# Patient Record
Sex: Female | Born: 1985
Health system: Southern US, Community
[De-identification: ages and names within clinical notes are randomized; demographics above are authoritative.]

## PROBLEM LIST (undated history)

## (undated) DIAGNOSIS — M549 Dorsalgia, unspecified: Secondary | ICD-10-CM

## (undated) DIAGNOSIS — K5900 Constipation, unspecified: Secondary | ICD-10-CM

## (undated) DIAGNOSIS — F329 Major depressive disorder, single episode, unspecified: Secondary | ICD-10-CM

## (undated) DIAGNOSIS — F32A Depression, unspecified: Secondary | ICD-10-CM

## (undated) DIAGNOSIS — F419 Anxiety disorder, unspecified: Secondary | ICD-10-CM

## (undated) HISTORY — DX: Dorsalgia, unspecified: M54.9

## (undated) HISTORY — DX: Anxiety disorder, unspecified: F41.9

## (undated) HISTORY — DX: Major depressive disorder, single episode, unspecified: F32.9

## (undated) HISTORY — DX: Depression, unspecified: F32.A

## (undated) HISTORY — DX: Constipation, unspecified: K59.00

---
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
## 2012-10-27 ENCOUNTER — Encounter: Payer: Self-pay | Admitting: Obstetrics & Gynecology

## 2012-10-27 ENCOUNTER — Ambulatory Visit (INDEPENDENT_AMBULATORY_CARE_PROVIDER_SITE_OTHER): Payer: No Typology Code available for payment source | Admitting: Obstetrics & Gynecology

## 2012-10-27 ENCOUNTER — Telehealth: Payer: Self-pay | Admitting: Obstetrics & Gynecology

## 2012-10-27 VITALS — BP 122/90 | HR 80 | Resp 20 | Ht 66.0 in | Wt 232.4 lb

## 2012-10-27 DIAGNOSIS — Z0000 Encounter for general adult medical examination without abnormal findings: Secondary | ICD-10-CM

## 2012-10-27 DIAGNOSIS — Z01419 Encounter for gynecological examination (general) (routine) without abnormal findings: Secondary | ICD-10-CM

## 2012-10-27 DIAGNOSIS — Z30433 Encounter for removal and reinsertion of intrauterine contraceptive device: Secondary | ICD-10-CM

## 2012-10-27 DIAGNOSIS — Z124 Encounter for screening for malignant neoplasm of cervix: Secondary | ICD-10-CM

## 2012-10-27 NOTE — Telephone Encounter (Signed)
Patient calling to let Dr. Hyacinth Meeker know she made it safely home and also to say "thank you" for helping her. FYI only, no reason to call.

## 2012-10-27 NOTE — Progress Notes (Signed)
27 y.o. G0P0000 Married CaucasianF here for annual exam.  Has Mirena IUD that was due to be changed 8/14.  Patient doesn't really keep up with cycles.  Lives in [place].  Wants IUD replaced today.  Patient's last menstrual period was 10/03/2012.          Sexually active: yes  The current method of family planning is IUD.    Exercising: no  not regularly Smoker:  no  Health Maintenance: Pap:  ? 2009, was last done here History of abnormal Pap:  no MMG:  none Colonoscopy:  none BMD:   none TDaP:  Up to date-around 2007 Screening Labs: n/a, Hb today: n/a, Urine today: WBC-trace, PH-6.0   reports that she has never smoked. She has never used smokeless tobacco. She reports that she drinks alcohol. She reports that she does not use illicit drugs.  History reviewed. No pertinent past medical history.  History reviewed. No pertinent past surgical history.  Current Outpatient Prescriptions  Medication Sig Dispense Refill  . levonorgestrel (MIRENA) 20 MCG/24HR IUD 1 each by Intrauterine route once.       No current facility-administered medications for this visit.    Family History  Problem Relation Age of Onset  . Breast cancer Maternal Aunt   . Diabetes Mother   . Stroke Maternal Grandmother     ROS:  Pertinent items are noted in HPI.  Otherwise, a comprehensive ROS was negative.  Exam:   BP 122/90  Pulse 80  Resp 20  Ht 5\' 6"  (1.676 m)  Wt 232 lb 6.4 oz (105.416 kg)  BMI 37.53 kg/m2  LMP 10/03/2012  Height: 5\' 6"  (167.6 cm)  Ht Readings from Last 3 Encounters:  10/27/12 5\' 6"  (1.676 m)    General appearance: alert, cooperative and appears stated age Head: Normocephalic, without obvious abnormality, atraumatic Neck: no adenopathy, supple, symmetrical, trachea midline and thyroid normal to inspection and palpation Lungs: clear to auscultation bilaterally Breasts: normal appearance, no masses or tenderness Heart: regular rate and rhythm Abdomen: soft, non-tender;  bowel sounds normal; no masses,  no organomegaly Extremities: extremities normal, atraumatic, no cyanosis or edema Skin: Skin color, texture, turgor normal. No rashes or lesions Lymph nodes: Cervical, supraclavicular, and axillary nodes normal. No abnormal inguinal nodes palpated Neurologic: Grossly normal   Pelvic: External genitalia:  no lesions              Urethra:  normal appearing urethra with no masses, tenderness or lesions              Bartholins and Skenes: normal                 Vagina: normal appearing vagina with normal color and discharge, no lesions              Cervix: no lesions              Pap taken: yes Bimanual Exam:  Uterus:  normal size, contour, position, consistency, mobility, non-tender              Adnexa: normal adnexa and no mass, fullness, tenderness               Rectovaginal: Confirms               Anus:  normal sphincter tone, no lesions  IUD due to be changed.  D/w pt rare chance of early pregnancy but given that she hasn't been here in over 3 years, I feel benefits outweigh risks.  Patient in agreement.  Consent obtained.    Procedure:  Speculum placed.  IUD string noted.  IUD removed with one pull.  IUD seen by Lorrene Reid, CMA, and discarded.  Cervix cleansed with Betadine x 3.  Tenaculum placed at 12 o'clock on anterior lip of cervix.  Uterus sounded to 8 cm.  IUD and introducer passed to fundus without difficulty.  IUD released.  Introducer removed.  Strings cut to 2cm.  Pt tolerated procedure well.  Tenaculum removed.  Minimal bleeding from tenaculum site.   Lot # [id number]. Exp 11/16.  A:  Well Woman with normal exam IUD removal/replacement today.  P:   Mammogram starting age 30 pap smear today. Recheck IUD strings 6 weeks. return annually or prn  An After Visit Summary was printed and given to the patient.

## 2012-10-27 NOTE — Patient Instructions (Signed)

## 2012-10-28 LAB — IPS PAP SMEAR ONLY

## 2012-12-07 ENCOUNTER — Telehealth: Payer: Self-pay | Admitting: Obstetrics & Gynecology

## 2012-12-07 NOTE — Telephone Encounter (Signed)
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 Rescheduled for 12-15-12 at 3 pm.

## 2012-12-07 NOTE — Telephone Encounter (Signed)
Patient is worried about making it on time to her 2:30 PM appointment with Dr. Hyacinth Meeker 12/08/12 for a six week recheck. Patient does not want to cancel the appointment but does want to speak with the nurse about possible rescheduling her to later in the week.

## 2012-12-08 ENCOUNTER — Ambulatory Visit: Payer: No Typology Code available for payment source | Admitting: Obstetrics & Gynecology

## 2012-12-15 ENCOUNTER — Encounter: Payer: Self-pay | Admitting: Obstetrics & Gynecology

## 2012-12-15 ENCOUNTER — Ambulatory Visit (INDEPENDENT_AMBULATORY_CARE_PROVIDER_SITE_OTHER): Payer: No Typology Code available for payment source | Admitting: Obstetrics & Gynecology

## 2012-12-15 ENCOUNTER — Ambulatory Visit: Payer: No Typology Code available for payment source | Admitting: Obstetrics & Gynecology

## 2012-12-15 VITALS — BP 122/84 | HR 80 | Resp 20 | Ht 66.0 in | Wt 235.2 lb

## 2012-12-15 DIAGNOSIS — Z30431 Encounter for routine checking of intrauterine contraceptive device: Secondary | ICD-10-CM

## 2012-12-15 NOTE — Patient Instructions (Signed)
Please call if you have any new issues/problems 

## 2012-12-15 NOTE — Progress Notes (Signed)
27 y.o. G43P0000 Married Caucasian female presents for recheck after having Mirena IUD placed.  Had occasional cramping after placement.  Cycle was light and darkish.  Lasted 3 days.  No spotting.  No pain with intercourse.    LMP:  Patient's last menstrual period was 12/04/2012.  Pelvic exam: Vulva:  normal Vagina:  normal vagina Cervix:  Non-tender, Negative CMT, no lesions or redness, IUD string noted and 2.5cm in length Uterus:normal size, non tender   A: Recheck after IUD placement  P: Pt to return for AEX 1 year or prn problems.

## 2015-03-26 ENCOUNTER — Encounter (HOSPITAL_COMMUNITY): Payer: Self-pay | Admitting: Emergency Medicine

## 2015-03-26 ENCOUNTER — Emergency Department (INDEPENDENT_AMBULATORY_CARE_PROVIDER_SITE_OTHER)
Admission: EM | Admit: 2015-03-26 | Discharge: 2015-03-26 | Disposition: A | Discharge status: Home / Self Care | Payer: Worker's Compensation | Source: Home / Self Care | Attending: Emergency Medicine | Admitting: Emergency Medicine

## 2015-03-26 DIAGNOSIS — M545 Low back pain, unspecified: Secondary | ICD-10-CM

## 2015-03-26 MED ORDER — MELOXICAM 15 MG PO TABS: 15.0000 mg | ORAL_TABLET | Freq: Every day | ORAL | Status: DC

## 2015-03-26 MED ORDER — PREDNISONE 50 MG PO TABS
ORAL_TABLET | ORAL | Status: DC
Start: 2015-03-26 — End: 2017-09-11

## 2015-03-26 MED ORDER — TRAMADOL HCL 50 MG PO TABS: 50.0000 mg | ORAL_TABLET | Freq: Four times a day (QID) | ORAL | Status: DC | PRN

## 2015-03-26 NOTE — ED Provider Notes (Signed)
CSN: [id number]     Arrival date & time 03/26/15  1614 History   First MD Initiated Contact with Patient 03/26/15 1707     Chief Complaint  Patient presents with  . Back Pain   (Consider location/radiation/quality/duration/timing/severity/associated sxs/prior Treatment) HPI  She is a 30 year old woman here for evaluation of back pain. She states that on Friday she is working for the [employer] when she picked up some packages.  She states the packages weight over 100 pounds. She had to load them onto a cart and then push some upper ramp. She states at that time she felt some soreness in her lower back. Yesterday, she had worsening of the pain, particularly with forward flexion. She states the pain would radiate into her right buttock and also up her back. This was improved with a heating pad. She denies any radicular pain. No numbness, tingling, weakness. No bowel or bladder incontinence. No history of back problems.  History reviewed. No pertinent past medical history. History reviewed. No pertinent past surgical history. Family History  Problem Relation Age of Onset  . Breast cancer Maternal Aunt   . Diabetes Mother   . Stroke Maternal Grandmother    Social History  Substance Use Topics  . Smoking status: Never Smoker   . Smokeless tobacco: Never Used  . Alcohol Use: Yes     Comment: occ   OB History    Gravida Para Term Preterm AB TAB SAB Ectopic Multiple Living   0 0 0 0 0 0 0 0 0 0      Review of Systems As in history of present illness Allergies  Review of patient's allergies indicates no known allergies.  Home Medications   Prior to Admission medications   Medication Sig Start Date End Date Taking? Authorizing Provider  levonorgestrel (MIRENA) 20 MCG/24HR IUD 1 each by Intrauterine route once.    Historical Provider, MD  meloxicam (MOBIC) 15 MG tablet Take 1 tablet (15 mg total) by mouth daily. 03/26/15   Charm RingsErin J Rania Prothero, MD  predniSONE (DELTASONE) 50 MG tablet Take 1  pill daily for 5 days. 03/26/15   Charm RingsErin J Shihab States, MD  traMADol (ULTRAM) 50 MG tablet Take 1 tablet (50 mg total) by mouth every 6 (six) hours as needed. 03/26/15   Charm RingsErin J Aishani Kalis, MD   Meds Ordered and Administered this Visit  Medications - No data to display  BP 153/92 mmHg  Pulse 92  Temp(Src) 98.3 F (36.8 C) (Oral)  Resp 18  SpO2 100% No data found.   Physical Exam  Constitutional: She is oriented to person, place, and time. She appears well-developed and well-nourished. No distress.  Cardiovascular: Normal rate.   Pulmonary/Chest: Effort normal.  Musculoskeletal:  Back: No erythema or edema. No vertebral step-offs. She is tender in the midline lumbar area. There is some mild spasm in the bilateral parathoracic spinous muscles. 5 out of 5 strength in bilateral lower extremities. Negative straight leg raise bilaterally.  Neurological: She is alert and oriented to person, place, and time.    ED Course  Procedures (including critical care time)  Labs Review Labs Reviewed - No data to display  Imaging Review No results found.   MDM   1. Midline low back pain without sciatica    Treat with prednisone and meloxicam. Alternate ice and heat. Prescription for tramadol given to use as needed for severe pain. Work note provided. No heavy lifting for the next week. If not improving in the next 5-7 days,  follow-up with occupational health.    Charm Rings, MD 03/26/15 [phone]

## 2015-03-26 NOTE — ED Notes (Signed)
Pt reports she inj her back on 3/17 while at work.... Back pain increases w/activity and when she bends over A&O x4... Slow gait; No acute disterss

## 2015-03-26 NOTE — Discharge Instructions (Signed)
You strained the muscles and tendons of your back. Take prednisone daily for 5 days. Take meloxicam daily for 5 days, then as needed for pain. Use the tramadol at bedtime as needed for pain. This medicine will make you drowsy. Alternate ice and heat to your back. If things are not improving in the next week, please follow-up at occupational health.

## 2015-10-06 ENCOUNTER — Telehealth: Payer: Self-pay | Admitting: Obstetrics & Gynecology

## 2015-10-06 NOTE — Telephone Encounter (Signed)
Left patient a message to call back to reschedule a future appointment that was cancelled by the provider. °

## 2015-10-20 ENCOUNTER — Ambulatory Visit: Payer: No Typology Code available for payment source | Admitting: Obstetrics & Gynecology

## 2016-12-11 DIAGNOSIS — G8929 Other chronic pain: Secondary | ICD-10-CM | POA: Insufficient documentation

## 2016-12-11 DIAGNOSIS — F3341 Major depressive disorder, recurrent, in partial remission: Secondary | ICD-10-CM | POA: Insufficient documentation

## 2016-12-11 DIAGNOSIS — M545 Low back pain: Secondary | ICD-10-CM

## 2017-09-11 ENCOUNTER — Other Ambulatory Visit (HOSPITAL_COMMUNITY)
Admission: RE | Admit: 2017-09-11 | Discharge: 2017-09-11 | Disposition: A | Discharge status: Home / Self Care | Payer: No Typology Code available for payment source | Source: Ambulatory Visit | Attending: Obstetrics & Gynecology | Admitting: Obstetrics & Gynecology

## 2017-09-11 ENCOUNTER — Other Ambulatory Visit: Payer: Self-pay

## 2017-09-11 ENCOUNTER — Encounter: Payer: Self-pay | Admitting: Obstetrics & Gynecology

## 2017-09-11 ENCOUNTER — Ambulatory Visit: Payer: No Typology Code available for payment source | Admitting: Obstetrics & Gynecology

## 2017-09-11 VITALS — BP 110/64 | HR 88 | Resp 14 | Ht 65.75 in | Wt 196.0 lb

## 2017-09-11 DIAGNOSIS — Z01419 Encounter for gynecological examination (general) (routine) without abnormal findings: Secondary | ICD-10-CM

## 2017-09-11 DIAGNOSIS — Z202 Contact with and (suspected) exposure to infections with a predominantly sexual mode of transmission: Secondary | ICD-10-CM | POA: Diagnosis not present

## 2017-09-11 DIAGNOSIS — N766 Ulceration of vulva: Secondary | ICD-10-CM

## 2017-09-11 DIAGNOSIS — Z124 Encounter for screening for malignant neoplasm of cervix: Secondary | ICD-10-CM | POA: Insufficient documentation

## 2017-09-11 DIAGNOSIS — Z30433 Encounter for removal and reinsertion of intrauterine contraceptive device: Secondary | ICD-10-CM | POA: Diagnosis not present

## 2017-09-11 NOTE — Progress Notes (Signed)
32 y.o. G0P0000 Legally SeparatedCaucasianF here for annual exam.  Has been separated for 2+ years.  Has an IUD.  Does not cycle regularly.  Bleeding is minimal when she cycles.  Is due for removal in October but would like to have it removed today if possible.    Weighed #246 at her heaviest.  Wende Crease #196 today.  Has been exercising and working on dietary changes.  This weight loss has been over a two year time frame.    Patient's last menstrual period was 08/06/2017 (approximate).          Sexually active: Yes.    The current method of family planning is IUD. Mirena placed 10/27/12   Exercising: No.   Smoker:  yes  Health Maintenance: Pap:  10/27/12 neg  History of abnormal Pap:  no MMG:  Never TDaP:  Unsure Screening Labs: PCP   reports that she has been smoking. She has been smoking about 0.50 packs per day. She has never used smokeless tobacco. She reports that she drank alcohol. She reports that she does not use drugs.  Past Medical History:  Diagnosis Date  . Anxiety   . Depression     History reviewed. No pertinent surgical history.  Current Outpatient Medications  Medication Sig Dispense Refill  . levonorgestrel (MIRENA) 20 MCG/24HR IUD 1 each by Intrauterine route once.    . naproxen sodium (ALEVE) 220 MG tablet Take by mouth daily as needed.    . pseudoephedrine-acetaminophen (TYLENOL SINUS) 30-500 MG TABS tablet Take by mouth daily as needed.     No current facility-administered medications for this visit.     Family History  Problem Relation Age of Onset  . Breast cancer Maternal Aunt   . Diabetes Mother   . Stroke Maternal Grandmother   . Lung cancer Maternal Grandfather   . Breast cancer Maternal Aunt   . Breast cancer Maternal Aunt     Review of Systems  All other systems reviewed and are negative.   Exam:   BP 110/64 (BP Location: Right Arm, Patient Position: Sitting, Cuff Size: Large)   Pulse 88   Resp 14   Ht 5' 5.75" (1.67 m)   Wt 196 lb  (88.9 kg)   LMP 08/06/2017 (Approximate)   BMI 31.88 kg/m     Height: 5' 5.75" (167 cm)  Ht Readings from Last 3 Encounters:  09/11/17 5' 5.75" (1.67 m)  12/15/12 5\' 6"  (1.676 m)  10/27/12 5\' 6"  (1.676 m)    General appearance: alert, cooperative and appears stated age Head: Normocephalic, without obvious abnormality, atraumatic Neck: no adenopathy, supple, symmetrical, trachea midline and thyroid normal to inspection and palpation Lungs: clear to auscultation bilaterally Breasts: normal appearance, no masses or tenderness Heart: regular rate and rhythm Abdomen: soft, non-tender; bowel sounds normal; no masses,  no organomegaly Extremities: extremities normal, atraumatic, no cyanosis or edema Skin: Skin color, texture, turgor normal. No rashes or lesions Lymph nodes: Cervical, supraclavicular, and axillary nodes normal. No abnormal inguinal nodes palpated Neurologic: Grossly normal   Pelvic: External genitalia:  Small ulcerated lesion just on edge of labia majora (pt reports this is an area where she's squeezed an ingrown hair)              Urethra:  normal appearing urethra with no masses, tenderness or lesions              Bartholins and Skenes: normal  Vagina: normal appearing vagina with normal color and discharge, no lesions              Cervix: no lesions              Pap taken: Yes.   Bimanual Exam:  Uterus:  normal size, contour, position, consistency, mobility, non-tender              Adnexa: no mass, fullness, tenderness               Rectovaginal: Confirms               Anus:  normal sphincter tone, no lesions  IUD removal and placement:   Consent obtianed.  Speculum placed.  Cervix cleansed with Betadine.  IUD string noted and grasped with one pull.  IUD easily removed.  Cervix cleansed again.  Anteriior lip of cervix grasped with single toothed tenaculum.  Uterus sounded to 8cm.  IUD and introducer passed to fundus and slightly withdrawn.  IUD released  into endometrial cavity without difficulty.  Lot:  [id number].  Exp:  11/21.  Strings cut to 2cm.  Tenaculum removed.  Pt tolerated procedure will.  Minimal bleeding noted.  Pt did feel a little light headed after procedure which passed quickly but she decided to not have any blood work or her TDAP updated today.  Will do these when she returns for recheck.  Chaperone was present for exam.  A:  Well Woman with normal exam Mirena IUD for contraception and cycle control, due for removal after 10/27/17 Tdap update due Desires STD testing  P:   Mammogram guidelines reviewed.   pap smear obtained today IUD removed and replaced today GC/CHl/Trich obtained today. Pt will have HIV, RPR and HepBSAg, Hep C obtained at follow up.   Tdap will be updated at next visit as well. Return annually or prn

## 2017-09-12 LAB — HERPES SIMPLEX VIRUS(HSV) DNA BY PCR

## 2017-09-12 LAB — CYTOLOGY - PAP
Chlamydia: NEGATIVE
Diagnosis: NEGATIVE
Neisseria Gonorrhea: NEGATIVE
TRICH (WINDOWPATH): NEGATIVE

## 2017-09-15 LAB — HSV NAA
HSV 1 NAA: NEGATIVE
HSV 2 NAA: NEGATIVE

## 2017-09-15 LAB — SPECIMEN STATUS REPORT

## 2017-09-23 ENCOUNTER — Telehealth: Payer: Self-pay | Admitting: Obstetrics & Gynecology

## 2017-09-23 NOTE — Telephone Encounter (Signed)
Call returned to patient, left detailed message, ok per dpr. Advised no additional recommendations from Dr. Hyacinth MeekerMiller. Keep f/u as scheduled for 10/2017. Return call to office if any additional questions. Encounter closed.

## 2017-09-23 NOTE — Telephone Encounter (Signed)
Patient is still bleeding after having an iud inserted.

## 2017-09-23 NOTE — Telephone Encounter (Signed)
Spoke with patient. Mirena IUD exchange on 09/11/17. Reports cycle has continued since exchange, does not recall cycles this long with previous Mirena.   Describes bleeding as old blood to pink, "possibly spotting", denies heavy bleeding, unsure how many times she is changing a pad. Intermittent menses like cramps 3/10. Denies N/V, fever/chills, odor, fatigue, weakness, lightheadness.   Advised patient can take 3 months for cycles to adjust to IUD, cycles may be lighter or none at all. Advised to continue to monitor, if new symptoms develop, bleeding continues or becomes heavy, return call to office for OV. Advised will review with Dr. Hyacinth MeekerMiller and return call if any additional recommendations.   Routing to Dr. Hyacinth MeekerMiller to review.

## 2017-09-23 NOTE — Telephone Encounter (Signed)
Agree with recommendations.  Ok to close encounter. 

## 2017-10-16 ENCOUNTER — Ambulatory Visit: Payer: No Typology Code available for payment source | Admitting: Obstetrics & Gynecology

## 2017-10-16 ENCOUNTER — Encounter

## 2017-10-23 ENCOUNTER — Ambulatory Visit: Payer: No Typology Code available for payment source | Admitting: Obstetrics & Gynecology

## 2017-10-24 ENCOUNTER — Other Ambulatory Visit: Payer: Self-pay

## 2017-10-24 ENCOUNTER — Ambulatory Visit: Payer: No Typology Code available for payment source | Admitting: Obstetrics & Gynecology

## 2017-10-24 ENCOUNTER — Encounter: Payer: Self-pay | Admitting: Obstetrics & Gynecology

## 2017-10-24 VITALS — BP 100/80 | HR 88 | Resp 16 | Ht 65.75 in | Wt 203.0 lb

## 2017-10-24 DIAGNOSIS — Z30431 Encounter for routine checking of intrauterine contraceptive device: Secondary | ICD-10-CM | POA: Diagnosis not present

## 2017-10-24 DIAGNOSIS — R102 Pelvic and perineal pain: Secondary | ICD-10-CM | POA: Diagnosis not present

## 2017-10-24 NOTE — Progress Notes (Signed)
GYNECOLOGY  VISIT  CC:   IUD recheck, pelvic cramping   HPI: 32 y.o. G0P0000 Legally Separated Caucasian female here for Mirena IUD check.  Had some spotting initially after IUD placement.  Has not had a cycle yet.  Had some LLQ pain and cramping right after IUD placement.  Still having some cramping but this is much less.  Has boyfriend that she is currently SA with.  Had STD testing at last visit 09/11/17.  This was all negative.   States she thinks she may be psyching herself into something being wrong as she does worry.  Does not desire treatment.  Was diagnosed with bronchitis this week.  Using supportive therapy.  GYNECOLOGIC HISTORY: No LMP recorded. (Menstrual status: IUD). Contraception: Mirena IUD placed 09/11/17 Menopausal hormone therapy: none  Patient Active Problem List   Diagnosis Date Noted  . Recurrent major depressive disorder, in partial remission (HCC) 12/11/2016  . Chronic midline low back pain without sciatica 12/11/2016    Past Medical History:  Diagnosis Date  . Anxiety   . Depression     History reviewed. No pertinent surgical history.  MEDS:   Current Outpatient Medications on File Prior to Visit  Medication Sig Dispense Refill  . benzonatate (TESSALON) 100 MG capsule 3 (three) times daily as needed.    Marland Kitchen levonorgestrel (MIRENA) 20 MCG/24HR IUD 1 each by Intrauterine route once.    . naproxen sodium (ALEVE) 220 MG tablet Take by mouth daily as needed.    . promethazine-dextromethorphan (PROMETHAZINE-DM) 6.25-15 MG/5ML syrup Take by mouth at bedtime.    . pseudoephedrine-acetaminophen (TYLENOL SINUS) 30-500 MG TABS tablet Take by mouth daily as needed.     No current facility-administered medications on file prior to visit.     ALLERGIES: Patient has no known allergies.  Family History  Problem Relation Age of Onset  . Breast cancer Maternal Aunt        "Bonita Quin" died in her 20's  . Diabetes Mother   . Stroke Maternal Grandmother   . Lung cancer  Maternal Grandfather   . Breast cancer Maternal Aunt        "Patsy" in her 64's  . Breast cancer Maternal Aunt        has breast cancer precursor and bilateral mastectomy    SH:  Separated, +SA  Review of Systems  Constitutional:       Weight gain   Genitourinary: Positive for frequency and urgency.  Psychiatric/Behavioral: The patient is nervous/anxious.        Depression   All other systems reviewed and are negative.   PHYSICAL EXAMINATION:    BP 100/80 (BP Location: Right Arm, Patient Position: Sitting, Cuff Size: Large)   Pulse 88   Resp 16   Ht 5' 5.75" (1.67 m)   Wt 203 lb (92.1 kg)   BMI 33.01 kg/m     General appearance: alert, cooperative and appears stated age Abdomen: soft, non-tender; bowel sounds normal; no masses,  no organomegaly Lymph:  no inguinal LAD noted  Pelvic: External genitalia:  no lesions              Urethra:  normal appearing urethra with no masses, tenderness or lesions              Bartholins and Skenes: normal                 Vagina: normal appearing vagina with normal color and discharge, no lesions  Cervix: no lesions and 2cm IUD string noted              Bimanual Exam:  Uterus:  normal size, contour, position, consistency, mobility, non-tender              Adnexa: no mass, fullness, tenderness  Chaperone was present for exam.  Assessment: IUD recheck with pelvic pain cramping that has improved and normal exam today  Plan: Pt will call if cramping/pain persists over the next 4-6 weeks.  Will plan PUS then.  Pt comfortable with plan.

## 2017-11-07 ENCOUNTER — Telehealth: Payer: Self-pay | Admitting: Obstetrics & Gynecology

## 2017-11-07 NOTE — Telephone Encounter (Signed)
Return call to patient. Has been on Phentermine in past and interested in refill.  Would like to know if Dr Hyacinth Meeker will prescribe. Advised needs office visit to discuss. Appointment scheduled for 11-11-17 at 315.   Routing to provider. Encounter closed.

## 2017-11-07 NOTE — Telephone Encounter (Signed)
Patient has some questions regarding her weight.

## 2017-11-11 ENCOUNTER — Encounter: Payer: Self-pay | Admitting: Obstetrics & Gynecology

## 2017-11-11 ENCOUNTER — Ambulatory Visit: Payer: No Typology Code available for payment source | Admitting: Obstetrics & Gynecology

## 2017-11-11 VITALS — BP 110/70 | Resp 16 | Ht 65.75 in | Wt 205.6 lb

## 2017-11-11 DIAGNOSIS — Z6833 Body mass index (BMI) 33.0-33.9, adult: Secondary | ICD-10-CM

## 2017-11-11 MED ORDER — PHENTERMINE HCL 37.5 MG PO CAPS: 37.5000 mg | ORAL_CAPSULE | ORAL | 1 refills | Status: DC

## 2017-11-11 NOTE — Patient Instructions (Signed)
Colace (docusate sodium) 100mg  up to four times a day.

## 2017-11-11 NOTE — Progress Notes (Signed)
GYNECOLOGY  VISIT  CC:   discuss  HPI: 32 y.o. G0P0000 Legally Separated Caucasian female.  She is frustrated with her weight.  Lost about 30 pounds over a year ago and feels stuck.  Is exercising two to three times a week at home.  Not doing any particular eating program at this time.  Would like to discuss starting phentermine to help jump start weight loss.  Medication risk discussed.  She would like to start this.  GYNECOLOGIC HISTORY: Patient's last menstrual period was 09/07/2017 (approximate). Contraception: IUD  Patient Active Problem List   Diagnosis Date Noted  . Recurrent major depressive disorder, in partial remission (HCC) 12/11/2016  . Chronic midline low back pain without sciatica 12/11/2016    Past Medical History:  Diagnosis Date  . Anxiety   . Depression     History reviewed. No pertinent surgical history.  MEDS:   Current Outpatient Medications on File Prior to Visit  Medication Sig Dispense Refill  . levonorgestrel (MIRENA) 20 MCG/24HR IUD 1 each by Intrauterine route once.    . naproxen sodium (ALEVE) 220 MG tablet Take by mouth daily as needed.    . meloxicam (MOBIC) 15 MG tablet Take 15 mg by mouth daily.    . methocarbamol (ROBAXIN) 500 MG tablet Take 500 mg by mouth every 8 (eight) hours as needed for muscle spasms.     No current facility-administered medications on file prior to visit.     ALLERGIES: Patient has no known allergies.  Family History  Problem Relation Age of Onset  . Breast cancer Maternal Aunt        "Bonita Quin" died in her 18's  . Diabetes Mother   . Stroke Maternal Grandmother   . Lung cancer Maternal Grandfather   . Breast cancer Maternal Aunt        "Patsy" in her 2's  . Breast cancer Maternal Aunt        has breast cancer precursor and bilateral mastectomy    SH:  Separated, smoking 1/2 PPD  Review of Systems  Musculoskeletal:       Neck pain  All other systems reviewed and are negative.   PHYSICAL EXAMINATION:     BP 110/70 (BP Location: Right Arm, Patient Position: Sitting, Cuff Size: Large)   Resp 16   Ht 5' 5.75" (1.67 m)   Wt 205 lb 9.6 oz (93.3 kg)   LMP 09/07/2017 (Approximate)   BMI 33.44 kg/m     General appearance: alert, cooperative and appears stated age CV:  Regular rate and rhythm Lungs:  clear to auscultation, no wheezes, rales or rhonchi, symmetric air entry  Assessment: BMI 33.4, desires weight loss  Plan: Will start phentermine 37.5mg  daily.  #30/1RF.  Recheck 2 months.  Pt knows to call with any side effects or concerns.   ~15 minutes spent with patient >50% of time was in face to face discussion of above.

## 2018-01-09 ENCOUNTER — Encounter: Payer: Self-pay | Admitting: Obstetrics & Gynecology

## 2018-01-09 ENCOUNTER — Ambulatory Visit (INDEPENDENT_AMBULATORY_CARE_PROVIDER_SITE_OTHER): Payer: No Typology Code available for payment source | Admitting: Obstetrics & Gynecology

## 2018-01-09 VITALS — BP 118/60 | HR 88 | Resp 16 | Ht 66.0 in | Wt 198.0 lb

## 2018-01-09 DIAGNOSIS — Z6833 Body mass index (BMI) 33.0-33.9, adult: Secondary | ICD-10-CM

## 2018-01-09 MED ORDER — PHENTERMINE HCL 37.5 MG PO CAPS: 37.5000 mg | ORAL_CAPSULE | ORAL | 1 refills | Status: DC

## 2018-01-09 NOTE — Progress Notes (Signed)
GYNECOLOGY  VISIT  CC:   Medication recheck  HPI: 33 y.o. G0P0000 Legally Separated Caucasian female here for recheck.  Bleeding finally stopped in early November.  Then she had a little spotting for just a few days this past year.    She started phentermine in early November.   Denies headache, visual changes, SOB, insomnia, jitters with medication.    Starting weight:  205 Weight today:  198  GYNECOLOGIC HISTORY: No LMP recorded. (Menstrual status: IUD). Contraception: IUD Menopausal hormone therapy: none  Patient Active Problem List   Diagnosis Date Noted  . Recurrent major depressive disorder, in partial remission (HCC) 12/11/2016  . Chronic midline low back pain without sciatica 12/11/2016    Past Medical History:  Diagnosis Date  . Anxiety   . Depression     No past surgical history on file.  MEDS:   Current Outpatient Medications on File Prior to Visit  Medication Sig Dispense Refill  . levonorgestrel (MIRENA) 20 MCG/24HR IUD 1 each by Intrauterine route once.    . meloxicam (MOBIC) 15 MG tablet Take 15 mg by mouth daily.    . methocarbamol (ROBAXIN) 500 MG tablet Take 500 mg by mouth every 8 (eight) hours as needed for muscle spasms.    . naproxen sodium (ALEVE) 220 MG tablet Take by mouth daily as needed.    . phentermine 37.5 MG capsule Take 1 capsule (37.5 mg total) by mouth every morning. 30 capsule 1   No current facility-administered medications on file prior to visit.     ALLERGIES: Patient has no known allergies.  Family History  Problem Relation Age of Onset  . Breast cancer Maternal Aunt        "Bonita QuinLinda" died in her 3760's  . Diabetes Mother   . Stroke Maternal Grandmother   . Lung cancer Maternal Grandfather   . Breast cancer Maternal Aunt        "Patsy" in her 7150's  . Breast cancer Maternal Aunt        has breast cancer precursor and bilateral mastectomy    SH:  Separated, smoker  Review of Systems  All other systems reviewed and are  negative.   PHYSICAL EXAMINATION:    BP 118/60   Pulse 88   Resp 16   Ht 5\' 6"  (1.676 m)   Wt 198 lb (89.8 kg)   BMI 31.96 kg/m     General appearance: alert, cooperative and appears stated age CV:  Regular rate and rhythm Lungs:   CTA without rales, rhonchi, wheezes  Assessment: BMI 33, desires weight loss  Plan: Phentermine 37.5 mg daily.  #30/1RF. Recheck 2 months.

## 2018-01-16 ENCOUNTER — Ambulatory Visit: Payer: No Typology Code available for payment source | Admitting: Obstetrics & Gynecology

## 2018-02-13 ENCOUNTER — Telehealth: Payer: Self-pay | Admitting: Obstetrics & Gynecology

## 2018-02-13 NOTE — Telephone Encounter (Signed)
Patient says she is doing fine and wondering if she should keep her 2 month recheck appointment in March.

## 2018-02-13 NOTE — Telephone Encounter (Signed)
Call to patient. Patient states she is still taking the phentermine and just wanted to make sure she needed to keep follow up appointment in March. RN advised to keep appointment as scheduled for 03-10-2018. Patient verbalized understanding and agreeable.   Routing to provider and will close encounter.

## 2018-03-10 ENCOUNTER — Other Ambulatory Visit: Payer: Self-pay

## 2018-03-10 ENCOUNTER — Encounter: Payer: Self-pay | Admitting: Obstetrics & Gynecology

## 2018-03-10 ENCOUNTER — Ambulatory Visit: Payer: No Typology Code available for payment source | Admitting: Obstetrics & Gynecology

## 2018-03-10 VITALS — BP 112/80 | HR 100 | Resp 16 | Ht 66.0 in | Wt 191.4 lb

## 2018-03-10 DIAGNOSIS — Z6833 Body mass index (BMI) 33.0-33.9, adult: Secondary | ICD-10-CM

## 2018-03-10 MED ORDER — PHENTERMINE HCL 37.5 MG PO CAPS: 37.5000 mg | ORAL_CAPSULE | ORAL | 1 refills | Status: DC

## 2018-03-10 NOTE — Progress Notes (Signed)
GYNECOLOGY  VISIT  CC:   Phentermine follow up  HPI: 33 y.o. G0P0000 Legally Separated Caucasian female here for phentermine follow up.  She is doing well with this.  She's lost about 14 pounds.  She denies SOB, palpitations, visual changes.  Denies insomnia.  Does have occ headaches but this is not a change from her baseline.    Continues to do PT once a week.  She is not really taking meloxicam or methocarbamol regularly.  Neck and back are much better.    Cycles are dark and light.  Started 3/1 and there is very little bleeding today.    GYNECOLOGIC HISTORY: Patient's last menstrual period was 03/08/2018. Contraception: IUD. Mirena placed 09/11/17 Menopausal hormone therapy: none  Patient Active Problem List   Diagnosis Date Noted  . Recurrent major depressive disorder, in partial remission (HCC) 12/11/2016  . Chronic midline low back pain without sciatica 12/11/2016    Past Medical History:  Diagnosis Date  . Anxiety   . Depression     History reviewed. No pertinent surgical history.  MEDS:   Current Outpatient Medications on File Prior to Visit  Medication Sig Dispense Refill  . levonorgestrel (MIRENA) 20 MCG/24HR IUD 1 each by Intrauterine route once.    . meloxicam (MOBIC) 15 MG tablet Take 15 mg by mouth daily.    . methocarbamol (ROBAXIN) 500 MG tablet Take 500 mg by mouth every 8 (eight) hours as needed for muscle spasms.    . naproxen sodium (ALEVE) 220 MG tablet Take by mouth daily as needed.    . phentermine 37.5 MG capsule Take 1 capsule (37.5 mg total) by mouth every morning. 30 capsule 1   No current facility-administered medications on file prior to visit.     ALLERGIES: Patient has no known allergies.  Family History  Problem Relation Age of Onset  . Breast cancer Maternal Aunt        "Joyce Wright" died in her 81's  . Diabetes Mother   . Stroke Maternal Grandmother   . Lung cancer Maternal Grandfather   . Breast cancer Maternal Aunt        "Joyce Wright" in her  37's  . Breast cancer Maternal Aunt        has breast cancer precursor and bilateral mastectomy    SH:  Separated, smoker  Review of Systems  Gastrointestinal: Positive for constipation.  All other systems reviewed and are negative.   PHYSICAL EXAMINATION:    BP 112/80 (BP Location: Right Arm, Patient Position: Sitting, Cuff Size: Large)   Pulse 100   Resp 16   Ht 5\' 6"  (1.676 m)   Wt 191 lb 6.4 oz (86.8 kg)   LMP 03/08/2018   BMI 30.89 kg/m     General appearance: alert, cooperative and appears stated age CV:  Regular rate and rhythm Lungs:  clear to auscultation, no wheezes, rales or rhonchi, symmetric air entry  Assessment: BMI 33, now 30.8, with desired continue weight loss IUD in place with significant improvement in cycle  Plan: RF for Phentermine 37.5mg  daily.  #30/1RF Recheck 2 months.   ~15 minutes spent with patient >50% of time was in face to face discussion of above.

## 2018-04-15 DIAGNOSIS — J301 Allergic rhinitis due to pollen: Secondary | ICD-10-CM | POA: Insufficient documentation

## 2018-04-30 ENCOUNTER — Telehealth: Payer: Self-pay | Admitting: Obstetrics & Gynecology

## 2018-04-30 NOTE — Telephone Encounter (Signed)
I called patient to reschedule her phentermine medication check due to Dr.Milelr being out of the office. She asked if it is really necessary to have a follow up appointment at this time due to coronaviru?Marland Kitchen

## 2018-04-30 NOTE — Telephone Encounter (Signed)
Spoke with patient. Advised f/u is needed with b/p and weight to continue phentermine. Offered WebEx or OV with Dr. Hyacinth Meeker. Patient states she is going to check to see if her mom has a b/p machine, will return call to office to advise on how to proceed. Patient thankful for return call.

## 2018-05-01 NOTE — Telephone Encounter (Signed)
Patient returning call to [place]. States she does have access to blood pressure machine.

## 2018-05-01 NOTE — Telephone Encounter (Signed)
Spoke with patient. Patient does have electronic b/p machine and is able to check weight. Requesting WebEx visit with Dr. Hyacinth Meeker. WebEx visit scheduled for 05/05/18 at 1pm with Dr. Hyacinth Meeker, email address updated.  Routing to provider for final review. Patient is agreeable to disposition. Will close encounter.

## 2018-05-05 ENCOUNTER — Ambulatory Visit (INDEPENDENT_AMBULATORY_CARE_PROVIDER_SITE_OTHER): Payer: No Typology Code available for payment source | Admitting: Obstetrics & Gynecology

## 2018-05-05 ENCOUNTER — Encounter: Payer: Self-pay | Admitting: Obstetrics & Gynecology

## 2018-05-05 ENCOUNTER — Other Ambulatory Visit: Payer: Self-pay

## 2018-05-05 VITALS — Ht 66.0 in | Wt 189.0 lb

## 2018-05-05 DIAGNOSIS — Z6833 Body mass index (BMI) 33.0-33.9, adult: Secondary | ICD-10-CM

## 2018-05-05 MED ORDER — PHENTERMINE HCL 37.5 MG PO CAPS: 37.5000 mg | ORAL_CAPSULE | ORAL | 1 refills | Status: DC

## 2018-05-05 NOTE — Progress Notes (Signed)
Virtual Visit via Video Note  I connected with Joyce Wright on 05/05/18 at  1:00 PM EDT by a video enabled telemedicine application and verified that I am speaking with the correct person using two identifiers.   I discussed the limitations of evaluation and management by telemedicine and the availability of in person appointments. The patient expressed understanding and agreed to proceed.  History of Present Illness:  33 yo legally separated WF to discuss continuation of phentermine for weight loss.  Has not been able to check blood pressure but she did try and machine just is not working correctly.  Denies headache, SOB, palpitations or jitters.  Having no trouble sleeping.  Weight is #189.  She has lost two more pounds for total of 16# weight loss.  This is a total of 7.8% body weight loss. Reports she is struggling with her gym being closed.  Just doesn't get as motivated to exercise at home.  Eating is fine.  Did not use mobile app for counting calories and protein.  Recommended she do this during the next month.  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   Observations/Objective: WNWD WF, NAD  Assessment and Plan: BMI 33, currently 30.5 Smoker  Follow Up Instructions: Continue phentermine 37.5mg  daily.  #30/1RF Recheck 2 months.  Pt knows to call with any concerns/changes.   I discussed the assessment and treatment plan with the patient. The patient was provided an opportunity to ask questions and all were answered. The patient agreed with the plan and demonstrated an understanding of the instructions.  I provided 14 minutes of non-face-to-face time during this encounter.   Jerene Bears, MD

## 2018-05-08 ENCOUNTER — Ambulatory Visit: Payer: No Typology Code available for payment source | Admitting: Obstetrics & Gynecology

## 2018-07-13 ENCOUNTER — Telehealth: Payer: Self-pay | Admitting: Obstetrics & Gynecology

## 2018-07-13 MED ORDER — PHENTERMINE HCL 37.5 MG PO CAPS: 37.5000 mg | ORAL_CAPSULE | ORAL | 0 refills | Status: DC

## 2018-07-13 NOTE — Telephone Encounter (Signed)
Patient is asking for a refill of phentermine to [facility].

## 2018-07-13 NOTE — Telephone Encounter (Signed)
Rx completed for #30/0RF.  Thanks.

## 2018-07-13 NOTE — Telephone Encounter (Signed)
Patient has medication follow up 07-23-18 for phentermine. She is requesting refill prior to appointment. Dr.Miller please advise. Routed to provider.

## 2018-07-13 NOTE — Telephone Encounter (Signed)
Patient is scheduled for medication follow up and want to ask if she need to come in to see Dr Sabra Heck before refill will be authorized.

## 2018-07-16 ENCOUNTER — Ambulatory Visit: Payer: No Typology Code available for payment source | Admitting: Obstetrics & Gynecology

## 2018-07-23 ENCOUNTER — Ambulatory Visit: Payer: No Typology Code available for payment source | Admitting: Obstetrics & Gynecology

## 2018-07-29 ENCOUNTER — Other Ambulatory Visit: Payer: Self-pay

## 2018-07-31 ENCOUNTER — Telehealth (INDEPENDENT_AMBULATORY_CARE_PROVIDER_SITE_OTHER): Payer: No Typology Code available for payment source | Admitting: Obstetrics & Gynecology

## 2018-07-31 ENCOUNTER — Encounter: Payer: Self-pay | Admitting: Obstetrics & Gynecology

## 2018-07-31 VITALS — BP 120/87 | Ht 66.0 in | Wt 194.0 lb

## 2018-07-31 DIAGNOSIS — Z6831 Body mass index (BMI) 31.0-31.9, adult: Secondary | ICD-10-CM

## 2018-07-31 MED ORDER — PHENTERMINE HCL 37.5 MG PO CAPS: 37.5000 mg | ORAL_CAPSULE | ORAL | 1 refills | Status: DC

## 2018-07-31 NOTE — Progress Notes (Signed)
Virtual Visit via Video Note  I connected with Joyce Wright on 07/31/18 at 12:00 PM EDT by a video enabled telemedicine application and verified that I am speaking with the correct person using two identifiers.  Location: Patient: home Provider: office   I discussed the limitations of evaluation and management by telemedicine and the availability of in person appointments. The patient expressed understanding and agreed to proceed.  History of Present Illness: 33 yo legally separated WF for visit to discuss phentermine for weight loss.  Weight today:  #194.  Has increased 5 pounds.  She is now working out on a Treadmill and yoga mats.  She has not been exercising as much the last few months.  She is not doing a particular diet.  Using Myfitnesspal app.  Reports she's had some birthday parties and has been eating some items that she really should not be eating.    She now has an exercise goal of 30 minutes every day.    BP:  120/87.  Has new blood pressure cuff and is checking this at home.    Has appt with optometrist in [place].   Observations/Objective: WNWD, NAD   Assessment and Plan: Obesity with BMI of 31.3 Continue with Phentermine 37,5mg  daily.  #30/1RF Recheck 2 months  Follow Up Instructions: I discussed the assessment and treatment plan with the patient. The patient was provided an opportunity to ask questions and all were answered. The patient agreed with the plan and demonstrated an understanding of the instructions.   The patient was advised to call back or seek an in-person evaluation if the symptoms worsen or if the condition fails to improve as anticipated.  I provided 15 minutes of non-face-to-face time during this encounter.   Megan Salon, MD

## 2018-10-16 ENCOUNTER — Telehealth: Payer: Self-pay | Admitting: Obstetrics & Gynecology

## 2018-10-16 NOTE — Telephone Encounter (Signed)
Patient needs to schedule appointment with Dr Sabra Heck for medication recheck. She has 1 pill left and not sure if this should be a web visit.

## 2018-10-16 NOTE — Telephone Encounter (Signed)
Spoke with patient. Patient calling to schedule follow-up for phentermine. Last WebEx on 07/31/18, recheck recommended in 2 months. Patient is able to check B/P from home. WebEx scheduled for 10/14 at 1:30pm with Dr. Sabra Heck. Confirmed e-mail address on file, process reviewed. Patient asking if she will be able to get a refill prior to visit, RN advised she would need recheck with provider prior to refills. Patient verbalizes understanding and is agreeable.   Routing to provider for final review. Patient is agreeable to disposition. Will close encounter.

## 2018-10-21 ENCOUNTER — Encounter: Payer: Self-pay | Admitting: Obstetrics & Gynecology

## 2018-10-21 ENCOUNTER — Telehealth (INDEPENDENT_AMBULATORY_CARE_PROVIDER_SITE_OTHER): Payer: No Typology Code available for payment source | Admitting: Obstetrics & Gynecology

## 2018-10-21 DIAGNOSIS — Z6830 Body mass index (BMI) 30.0-30.9, adult: Secondary | ICD-10-CM | POA: Diagnosis not present
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
## 2019-02-24 ENCOUNTER — Other Ambulatory Visit: Payer: Self-pay

## 2019-02-26 ENCOUNTER — Encounter: Payer: Self-pay | Admitting: Obstetrics & Gynecology

## 2019-02-26 ENCOUNTER — Other Ambulatory Visit: Payer: Self-pay

## 2019-02-26 ENCOUNTER — Ambulatory Visit: Payer: No Typology Code available for payment source | Admitting: Obstetrics & Gynecology

## 2019-02-26 VITALS — BP 132/78 | HR 84 | Temp 98.4°F | Resp 12 | Ht 65.75 in | Wt 208.0 lb

## 2019-02-26 DIAGNOSIS — Z23 Encounter for immunization: Secondary | ICD-10-CM

## 2019-02-26 DIAGNOSIS — Z01419 Encounter for gynecological examination (general) (routine) without abnormal findings: Secondary | ICD-10-CM | POA: Diagnosis not present

## 2019-02-26 DIAGNOSIS — Z79899 Other long term (current) drug therapy: Secondary | ICD-10-CM

## 2019-02-26 MED ORDER — PHENTERMINE HCL 37.5 MG PO TABS: 37.5000 mg | ORAL_TABLET | Freq: Every day | ORAL | 1 refills | Status: DC

## 2019-02-26 NOTE — Patient Instructions (Signed)
Plastic and Reconstructive Surgery - [facility] [address], [place] [address] [phone]  Dr. Solon [place]  Dr. [place] Phone:  [phone]  Address:  [address] [place], [place] [address]

## 2019-02-26 NOTE — Progress Notes (Signed)
34 y.o. G0P0000 Legally Separated Other or two or more races female here for annual exam.  Has a little bleeding with her menstrual cycles.  She is using a Mirena IUD.  Does not need STD testing.  No current new partner.   Having some shoulder issues.  Had two cortisone injections in the last 6 months.  Did have shoulder MRI.  Patient's last menstrual period was 01/25/2019 (within days).          Sexually active: Yes.    The current method of family planning is IUD inserted 09/11/17.    Exercising: Yes.    treadmill Smoker:  yes  Health Maintenance: Pap:   09/11/17 Neg  10/27/12 neg  History of abnormal Pap:  no TDaP:  unsure Hep C testing: n/a Screening Labs: done with Rosalyn Gess   reports that she has been smoking. She has been smoking about 0.50 packs per day. She has never used smokeless tobacco. She reports previous alcohol use. She reports that she does not use drugs.  Past Medical History:  Diagnosis Date  . Anxiety   . Depression     History reviewed. No pertinent surgical history.  Current Outpatient Medications  Medication Sig Dispense Refill  . levonorgestrel (MIRENA) 20 MCG/24HR IUD 1 each by Intrauterine route once.    . naproxen sodium (ALEVE) 220 MG tablet Take by mouth daily as needed.    . meloxicam (MOBIC) 15 MG tablet Take 15 mg by mouth daily.     No current facility-administered medications for this visit.    Family History  Problem Relation Age of Onset  . Breast cancer Maternal Aunt        "Vaughan Basta" died in her 84's  . Diabetes Mother   . Stroke Maternal Grandmother   . Lung cancer Maternal Grandfather   . Breast cancer Maternal Aunt        "Patsy" in her 90's  . Breast cancer Maternal Aunt        has breast cancer precursor and bilateral mastectomy    Review of Systems  All other systems reviewed and are negative.   Exam:   BP 132/78 (BP Location: Right Arm, Patient Position: Sitting, Cuff Size: Normal)   Pulse 84   Temp 98.4 F (36.9 C)  (Temporal)   Resp 12   Ht 5' 5.75" (1.67 m)   Wt 208 lb (94.3 kg)   LMP 01/25/2019 (Within Days)   BMI 33.83 kg/m    Height: 5' 5.75" (167 cm)  Ht Readings from Last 3 Encounters:  02/26/19 5' 5.75" (1.67 m)  07/31/18 5\' 6"  (1.676 m)  05/05/18 5\' 6"  (1.676 m)    General appearance: alert, cooperative and appears stated age Head: Normocephalic, without obvious abnormality, atraumatic Neck: no adenopathy, supple, symmetrical, trachea midline and thyroid normal to inspection and palpation Lungs: clear to auscultation bilaterally Breasts: normal appearance, no masses or tenderness Heart: regular rate and rhythm Abdomen: soft, non-tender; bowel sounds normal; no masses,  no organomegaly Extremities: extremities normal, atraumatic, no cyanosis or edema Skin: Skin color, texture, turgor normal. No rashes or lesions Lymph nodes: Cervical, supraclavicular, and axillary nodes normal. No abnormal inguinal nodes palpated Neurologic: Grossly normal   Pelvic: External genitalia:  no lesions              Urethra:  normal appearing urethra with no masses, tenderness or lesions              Bartholins and Skenes: normal  Vagina: normal appearing vagina with normal color and discharge, no lesions              Cervix: no lesions              Pap taken: No. Bimanual Exam:  Uterus:  normal size, contour, position, consistency, mobility, non-tender              Adnexa: normal adnexa and no mass, fullness, tenderness               Rectovaginal: Confirms               Anus:  normal sphincter tone, no lesions  Chaperone, Zenovia Jordan, CMA, was present for exam.  A:  Well Woman with normal exam Mirena IUD for contraception and cycle control Tdap update due Does not need STD testing today Three maternal aunts with breast cancer BMI 33.8  P:   Mammogram guidelines reviewed. pap smear guidelines reviewed.  Not indicated today. Tdap discussed Lab work done with Micael Hampshire Restart phentermine 37.5 tablet daily.  #30/1RF.  Recheck 2 month.  CMP today Return annually or prn

## 2019-02-27 LAB — COMPREHENSIVE METABOLIC PANEL
ALT: 15 IU/L (ref 0–32)
AST: 15 IU/L (ref 0–40)
Albumin/Globulin Ratio: 1.8 (ref 1.2–2.2)
Albumin: 4.3 g/dL (ref 3.8–4.8)
Alkaline Phosphatase: 55 IU/L (ref 39–117)
BUN/Creatinine Ratio: 14 (ref 9–23)
BUN: 10 mg/dL (ref 6–20)
Bilirubin Total: 0.3 mg/dL (ref 0.0–1.2)
CO2: 23 mmol/L (ref 20–29)
Calcium: 9.4 mg/dL (ref 8.7–10.2)
Chloride: 101 mmol/L (ref 96–106)
Creatinine, Ser: 0.71 mg/dL (ref 0.57–1.00)
GFR calc Af Amer: 131 mL/min/{1.73_m2} (ref >59)
GFR calc non Af Amer: 114 mL/min/{1.73_m2} (ref >59)
Globulin, Total: 2.4 g/dL (ref 1.5–4.5)
Glucose: 97 mg/dL (ref 65–99)
Potassium: 4.3 mmol/L (ref 3.5–5.2)
Sodium: 138 mmol/L (ref 134–144)
Total Protein: 6.7 g/dL (ref 6.0–8.5)

## 2019-04-26 ENCOUNTER — Telehealth: Payer: Self-pay | Admitting: Obstetrics & Gynecology

## 2019-04-26 NOTE — Telephone Encounter (Signed)
Last AEX 02/26/19. Pt restarted phentermine 37.5 tablet daily.  #30/1RF.  Recheck 2 month per AEX note.  IUD Mirena placed 09/2017 No MMG to date.   Spoke with pt. Pt requesting virtual visit for 2 month med recheck. Advised pt to check BP at home or at local pharmacy prior to appt. Pt has available wrist BP machine to check prior to appt.  Pt scheduled for Mychart video visit on 4/21 at 1 pm with Dr Hyacinth Meeker. Pt agreeable and verbalized understanding of date and time of appt and given instructions on how to use video visit.  Routing to Dr Hyacinth Meeker for review.  Encounter closed.

## 2019-04-26 NOTE — Telephone Encounter (Signed)
Patient was told to follow up with Dr.Miller after taking her medication. Seh asked if this could be done virtually?

## 2019-04-28 ENCOUNTER — Encounter: Payer: Self-pay | Admitting: Obstetrics & Gynecology

## 2019-04-28 ENCOUNTER — Telehealth (INDEPENDENT_AMBULATORY_CARE_PROVIDER_SITE_OTHER): Payer: No Typology Code available for payment source | Admitting: Obstetrics & Gynecology

## 2019-04-28 DIAGNOSIS — Z6831 Body mass index (BMI) 31.0-31.9, adult: Secondary | ICD-10-CM | POA: Diagnosis not present

## 2019-04-28 MED ORDER — PHENTERMINE HCL 37.5 MG PO TABS: 37.5000 mg | ORAL_TABLET | Freq: Every day | ORAL | 1 refills | Status: DC

## 2019-04-28 NOTE — Progress Notes (Signed)
Virtual Visit via Video Note  I connected with Joyce Wright on 04/28/19 at  1:00 PM EDT by a video enabled telemedicine application and verified that I am speaking with the correct person using two identifiers.  Location: Patient: in her car Provider: office   I discussed the limitations of evaluation and management by telemedicine and the availability of in person appointments. The patient expressed understanding and agreed to proceed.  History of Present Illness: 34 yo G Separate WF for discussion of weight loss medication.  She is using a meal plan for low carbs, lean meats, and increased vegetables.  She is findings this really works.  She is exercising floor exercises and treadmill three times a week.  She has a goal of weight being 170.  She started invisiline and he is having more headaches then in the past.  Sleep is good.  Denies SOB>   Observations/Objective: WNWD WF, NAD Weight: 193#  Assessment and Plan: BMI 31.4 today, desires continued weight loss  Follow Up Instructions: Rx for Phentermine 37.5mg  daily.  #30/1RF.  Recheck 2 months.     I discussed the assessment and treatment plan with the patient. The patient was provided an opportunity to ask questions and all were answered. The patient agreed with the plan and demonstrated an understanding of the instructions.   The patient was advised to call back or seek an in-person evaluation if the symptoms worsen or if the condition fails to improve as anticipated.  I provided 15 minutes of non-face-to-face time during this encounter.   Jerene Bears, MD

## 2019-06-30 ENCOUNTER — Other Ambulatory Visit: Payer: Self-pay | Admitting: Obstetrics & Gynecology

## 2019-06-30 NOTE — Telephone Encounter (Signed)
Pt need recheck.  Has been two months.  Rx for #30 done.

## 2019-06-30 NOTE — Telephone Encounter (Signed)
Medication refill request: Phentermine  Last AEX:  02/26/19 SM Next AEX: 05/25/20 Last MMG (if hormonal medication request): n/a Refill authorized: Please advise

## 2019-07-01 ENCOUNTER — Telehealth: Payer: Self-pay

## 2019-07-01 NOTE — Telephone Encounter (Signed)
Tried calling patient to schedule med recheck with Dr. Hyacinth Meeker. No answer, left message for patient to call our office back to schedule.

## 2019-07-02 NOTE — Telephone Encounter (Signed)
Patient is returning call.  °

## 2019-07-05 NOTE — Telephone Encounter (Signed)
Tried calling patient, no answer. Left message for patient to call me back to schedule med check.

## 2019-07-05 NOTE — Telephone Encounter (Signed)
Patient left message returning call.

## 2019-07-06 NOTE — Telephone Encounter (Signed)
Patient scheduled for med recheck 07/26/19. Patient verbalizes understanding and is agreeable. Okay to close encounter.

## 2019-07-20 NOTE — Progress Notes (Deleted)
GYNECOLOGY  VISIT  CC:   ***  HPI: 34 y.o. G0P0000 Legally Separated Other or two or more races female here for medication recheck.  GYNECOLOGIC HISTORY: No LMP recorded. (Menstrual status: IUD). Contraception: *** Menopausal hormone therapy: ***  Patient Active Problem List   Diagnosis Date Noted  . Seasonal allergic rhinitis due to pollen 04/15/2018  . Recurrent major depressive disorder, in partial remission (HCC) 12/11/2016  . Chronic midline low back pain without sciatica 12/11/2016    Past Medical History:  Diagnosis Date  . Anxiety   . Depression     No past surgical history on file.  MEDS:   Current Outpatient Medications on File Prior to Visit  Medication Sig Dispense Refill  . levonorgestrel (MIRENA) 20 MCG/24HR IUD 1 each by Intrauterine route once.    . meloxicam (MOBIC) 15 MG tablet Take 15 mg by mouth daily.    . naproxen sodium (ALEVE) 220 MG tablet Take by mouth daily as needed.    . phentermine (ADIPEX-P) 37.5 MG tablet TAKE 1 TABLET(37.5 MG) BY MOUTH DAILY BEFORE BREAKFAST 30 tablet 0   No current facility-administered medications on file prior to visit.    ALLERGIES: Patient has no known allergies.  Family History  Problem Relation Age of Onset  . Breast cancer Maternal Aunt        "Bonita Quin" died in her 42's  . Diabetes Mother   . Stroke Maternal Grandmother   . Lung cancer Maternal Grandfather   . Breast cancer Maternal Aunt        "Patsy" in her 38's  . Breast cancer Maternal Aunt        has breast cancer precursor and bilateral mastectomy    SH:  ***  Review of Systems  PHYSICAL EXAMINATION:    There were no vitals taken for this visit.    General appearance: alert, cooperative and appears stated age Neck: no adenopathy, supple, symmetrical, trachea midline and thyroid {CHL AMB PHY EX THYROID NORM DEFAULT:[id number]::"normal to inspection and palpation"} CV:  {Exam; heart brief:31539} Lungs:  {pe lungs ob:314451::"clear to  auscultation, no wheezes, rales or rhonchi, symmetric air entry"} Breasts: {Exam; breast:13139::"normal appearance, no masses or tenderness"} Abdomen: soft, non-tender; bowel sounds normal; no masses,  no organomegaly Lymph:  no inguinal LAD noted  Pelvic: External genitalia:  no lesions              Urethra:  normal appearing urethra with no masses, tenderness or lesions              Bartholins and Skenes: normal                 Vagina: normal appearing vagina with normal color and discharge, no lesions              Cervix: {CHL AMB PHY EX CERVIX NORM DEFAULT:[id number]::"no lesions"}              Bimanual Exam:  Uterus:  {CHL AMB PHY EX UTERUS NORM DEFAULT:[phone]::"normal size, contour, position, consistency, mobility, non-tender"}              Adnexa: {CHL AMB PHY EX ADNEXA NO MASS DEFAULT:[phone]::"no mass, fullness, tenderness"}              Rectovaginal: {yes no:314532}.  Confirms.              Anus:  normal sphincter tone, no lesions  Chaperone, ***Zenovia Jordan, CMA, was present for exam.  Assessment: ***  Plan: ***   ~{  NUMBERS; -10-45 JOINT ROM:10287} minutes spent with patient >50% of time was in face to face discussion of above.

## 2019-07-26 ENCOUNTER — Ambulatory Visit: Payer: No Typology Code available for payment source | Admitting: Obstetrics & Gynecology

## 2019-07-26 ENCOUNTER — Telehealth: Payer: Self-pay | Admitting: *Deleted

## 2019-07-26 NOTE — Telephone Encounter (Signed)
Spoke with patient. R/s 2 mo med recheck for phentermine. MyChart visit scheduled for 08/03/19 at 11:30am with Dr. Hyacinth Meeker. Advised patient I will update Dr. Hyacinth Meeker, our office will return call if any additional recommendations. Patient agreeable.   Routing to provider for final review. Patient is agreeable to disposition. Will close encounter.

## 2019-07-26 NOTE — Telephone Encounter (Signed)
Patient was late for appointment today and would like to reschedule for a mychart visit.

## 2019-07-29 ENCOUNTER — Other Ambulatory Visit: Payer: Self-pay | Admitting: Obstetrics & Gynecology

## 2019-08-03 ENCOUNTER — Other Ambulatory Visit: Payer: Self-pay

## 2019-08-03 ENCOUNTER — Telehealth (INDEPENDENT_AMBULATORY_CARE_PROVIDER_SITE_OTHER): Payer: No Typology Code available for payment source | Admitting: Obstetrics & Gynecology

## 2019-08-03 ENCOUNTER — Encounter: Payer: Self-pay | Admitting: Obstetrics & Gynecology

## 2019-08-03 VITALS — Ht 65.75 in | Wt 188.0 lb

## 2019-08-03 DIAGNOSIS — Z6830 Body mass index (BMI) 30.0-30.9, adult: Secondary | ICD-10-CM | POA: Diagnosis not present

## 2019-08-03 MED ORDER — PHENTERMINE HCL 37.5 MG PO TABS: ORAL_TABLET | ORAL | 1 refills | Status: DC

## 2019-08-03 NOTE — Progress Notes (Signed)
Virtual Visit via Video Note  I connected with Joyce Wright on 08/03/19 at 11:30 AM EDT by a video enabled telemedicine application and verified that I am speaking with the correct person using two identifiers.  Location: Patient: work Provider: office   I discussed the limitations of evaluation and management by telemedicine and the availability of in person appointments. The patient expressed understanding and agreed to proceed.  History of Present Illness: 34 yo G1 Separate white female for recheck with phentermine use.  Having no issues.  Denies headache, SOB, palpitations.  Not SA.  Has IUD in place.   Observations/Objective: WNWD WF, NAD Starting weight  208# (02/26/2019) Weight this morning at home:  188# She has a blood pressure cuff at home.  117/81 BP today. Body mass index is 30.58 kg/m.  Assessment and Plan: BMI 30.5.  Desires continued weight loss.  Currently down 20# and ~10% body weight  Follow Up Instructions: RF for phentermine to pharmacy on file.  Recheck 2 months.   I discussed the assessment and treatment plan with the patient. The patient was provided an opportunity to ask questions and all were answered. The patient agreed with the plan and demonstrated an understanding of the instructions.   The patient was advised to call back or seek an in-person evaluation if the symptoms worsen or if the condition fails to improve as anticipated.  I provided 10 minutes of non-face-to-face time during this encounter.   Jerene Bears, MD

## 2019-08-06 ENCOUNTER — Encounter: Payer: Self-pay | Admitting: Obstetrics & Gynecology

## 2019-10-13 ENCOUNTER — Other Ambulatory Visit: Payer: Self-pay | Admitting: *Deleted

## 2019-10-13 NOTE — Telephone Encounter (Signed)
Medication refill request: Phentermine  Last AEX:  02-26-19 SM Next AEX: 05-25-20 Last MMG (if hormonal medication request): n/a Refill authorized: Today, please advise.   Call to patient. Patient advised needs to schedule 2 month follow up for phentermine per OV note on 08-03-19 with Dr. Hyacinth Meeker. Patient agreeable. Patient scheduled for MyChart visit on 10-15-19 at 1000. Patient agreeable to date and time of appointment. Patient states she will need a refill of phentermine prior to appointment as she ran out on Friday. Pharmacy confirmed as [facility] on [facility] in [place]. RN advised would send request to Dr. Hyacinth Meeker. Patient agreeable.   Medication pended for #30, 0RF. Please refill if appropriate.

## 2019-10-15 ENCOUNTER — Encounter: Payer: Self-pay | Admitting: Obstetrics & Gynecology

## 2019-10-15 ENCOUNTER — Other Ambulatory Visit: Payer: Self-pay

## 2019-10-15 ENCOUNTER — Telehealth (INDEPENDENT_AMBULATORY_CARE_PROVIDER_SITE_OTHER): Payer: No Typology Code available for payment source | Admitting: Obstetrics & Gynecology

## 2019-10-15 DIAGNOSIS — Z6830 Body mass index (BMI) 30.0-30.9, adult: Secondary | ICD-10-CM

## 2019-10-15 MED ORDER — PHENTERMINE HCL 37.5 MG PO TABS: ORAL_TABLET | ORAL | 0 refills | Status: DC

## 2019-10-15 NOTE — Progress Notes (Signed)
Virtual Visit via Video Note  I connected with Joyce Wright on 10/15/19 at 10:00 AM EDT by a video enabled telemedicine application and verified that I am speaking with the correct person using two identifiers.  Location: Patient: working Provider: office   I discussed the limitations of evaluation and management by telemedicine and the availability of in person appointments. The patient expressed understanding and agreed to proceed.  History of Present Illness: 34 yo G0 SWF with virtual visit for discussion of phentermine.     Observations/Objective: WNWD WF, NAD Starting weight:  208# Weight this morning at home:  194# (she weighed on a different scale this morning)  Assessment and Plan: 34 yo G0 SWF for discussion of weight loss but hasn't lost any weight in about two months  Follow Up Instructions: Feel pt should stop phentermine with the end of the prescription Information about [facility] given.     I discussed the assessment and treatment plan with the patient. The patient was provided an opportunity to ask questions and all were answered. The patient agreed with the plan and demonstrated an understanding of the instructions.   The patient was advised to call back or seek an in-person evaluation if the symptoms worsen or if the condition fails to improve as anticipated.  I provided 15 minutes of non-face-to-face time during this encounter.   Jerene Bears, MD

## 2019-11-08 ENCOUNTER — Encounter (INDEPENDENT_AMBULATORY_CARE_PROVIDER_SITE_OTHER): Payer: Self-pay | Admitting: Family Medicine

## 2019-11-08 ENCOUNTER — Ambulatory Visit (INDEPENDENT_AMBULATORY_CARE_PROVIDER_SITE_OTHER): Payer: No Typology Code available for payment source | Admitting: Family Medicine

## 2019-11-08 ENCOUNTER — Other Ambulatory Visit: Payer: Self-pay

## 2019-11-08 VITALS — BP 105/71 | HR 104 | Temp 98.3°F | Ht 66.0 in | Wt 194.0 lb

## 2019-11-08 DIAGNOSIS — Z9189 Other specified personal risk factors, not elsewhere classified: Secondary | ICD-10-CM

## 2019-11-08 DIAGNOSIS — R5383 Other fatigue: Secondary | ICD-10-CM

## 2019-11-08 DIAGNOSIS — F418 Other specified anxiety disorders: Secondary | ICD-10-CM | POA: Diagnosis not present

## 2019-11-08 DIAGNOSIS — Z0289 Encounter for other administrative examinations: Secondary | ICD-10-CM

## 2019-11-08 DIAGNOSIS — R0602 Shortness of breath: Secondary | ICD-10-CM | POA: Diagnosis not present

## 2019-11-08 DIAGNOSIS — E559 Vitamin D deficiency, unspecified: Secondary | ICD-10-CM

## 2019-11-08 DIAGNOSIS — Z6831 Body mass index (BMI) 31.0-31.9, adult: Secondary | ICD-10-CM

## 2019-11-08 DIAGNOSIS — E669 Obesity, unspecified: Secondary | ICD-10-CM

## 2019-11-09 LAB — COMPREHENSIVE METABOLIC PANEL
ALT: 14 IU/L (ref 0–32)
AST: 19 IU/L (ref 0–40)
Albumin/Globulin Ratio: 1.9 (ref 1.2–2.2)
Albumin: 4.5 g/dL (ref 3.8–4.8)
Alkaline Phosphatase: 61 IU/L (ref 44–121)
BUN/Creatinine Ratio: 12 (ref 9–23)
BUN: 8 mg/dL (ref 6–20)
Bilirubin Total: 0.4 mg/dL (ref 0.0–1.2)
CO2: 24 mmol/L (ref 20–29)
Calcium: 9.1 mg/dL (ref 8.7–10.2)
Chloride: 102 mmol/L (ref 96–106)
Creatinine, Ser: 0.69 mg/dL (ref 0.57–1.00)
GFR calc Af Amer: 133 mL/min/{1.73_m2} (ref >59)
GFR calc non Af Amer: 116 mL/min/{1.73_m2} (ref >59)
Globulin, Total: 2.4 g/dL (ref 1.5–4.5)
Glucose: 90 mg/dL (ref 65–99)
Potassium: 4.7 mmol/L (ref 3.5–5.2)
Sodium: 138 mmol/L (ref 134–144)
Total Protein: 6.9 g/dL (ref 6.0–8.5)

## 2019-11-09 LAB — CBC WITH DIFFERENTIAL/PLATELET
Basophils Absolute: 0.1 10*3/uL (ref 0.0–0.2)
Basos: 1 %
EOS (ABSOLUTE): 0.1 10*3/uL (ref 0.0–0.4)
Eos: 2 %
Hematocrit: 39.1 % (ref 34.0–46.6)
Hemoglobin: 13.3 g/dL (ref 11.1–15.9)
Immature Grans (Abs): 0 10*3/uL (ref 0.0–0.1)
Immature Granulocytes: 0 %
Lymphocytes Absolute: 1.6 10*3/uL (ref 0.7–3.1)
Lymphs: 27 %
MCH: 31.1 pg (ref 26.6–33.0)
MCHC: 34 g/dL (ref 31.5–35.7)
MCV: 91 fL (ref 79–97)
Monocytes Absolute: 0.5 10*3/uL (ref 0.1–0.9)
Monocytes: 9 %
Neutrophils Absolute: 3.5 10*3/uL (ref 1.4–7.0)
Neutrophils: 61 %
Platelets: 318 10*3/uL (ref 150–450)
RBC: 4.28 x10E6/uL (ref 3.77–5.28)
RDW: 11.4 % — ABNORMAL LOW (ref 11.7–15.4)
WBC: 5.9 10*3/uL (ref 3.4–10.8)

## 2019-11-09 LAB — HEMOGLOBIN A1C
Est. average glucose Bld gHb Est-mCnc: 100 mg/dL
Hgb A1c MFr Bld: 5.1 % (ref 4.8–5.6)

## 2019-11-09 LAB — INSULIN, RANDOM: INSULIN: 6.4 u[IU]/mL (ref 2.6–24.9)

## 2019-11-09 LAB — LIPID PANEL WITH LDL/HDL RATIO
Cholesterol, Total: 148 mg/dL (ref 100–199)
HDL: 51 mg/dL (ref >39)
LDL Chol Calc (NIH): 87 mg/dL (ref 0–99)
LDL/HDL Ratio: 1.7 ratio (ref 0.0–3.2)
Triglycerides: 48 mg/dL (ref 0–149)
VLDL Cholesterol Cal: 10 mg/dL (ref 5–40)

## 2019-11-09 LAB — T4: T4, Total: 8.3 ug/dL (ref 4.5–12.0)

## 2019-11-09 LAB — VITAMIN B12: Vitamin B-12: 918 pg/mL (ref 232–1245)

## 2019-11-09 LAB — T3: T3, Total: 119 ng/dL (ref 71–180)

## 2019-11-09 LAB — TSH: TSH: 0.801 u[IU]/mL (ref 0.450–4.500)

## 2019-11-09 LAB — VITAMIN D 25 HYDROXY (VIT D DEFICIENCY, FRACTURES): Vit D, 25-Hydroxy: 43.5 ng/mL (ref 30.0–100.0)

## 2019-11-09 LAB — FOLATE: Folate: 7.2 ng/mL (ref >3.0)

## 2019-11-15 NOTE — Progress Notes (Signed)
Dear Jerene Bears, MD,   Thank you for referring Joyce Wright to our clinic. The following note includes my evaluation and treatment recommendations.  Chief Complaint:   OBESITY Joyce Wright (MR# [id number]) is a 34 y.o. female who presents for evaluation and treatment of obesity and related comorbidities. Current BMI is Body mass index is 31.31 kg/m. Joyce Wright has been struggling with her weight for many years and has been unsuccessful in either losing weight, maintaining weight loss, or reaching her healthy weight goal.  Joyce Wright is on phentermine 37.5 mg (on and off for 1 year). Living with parents. Breakfast-protein shake Fairlife and coffee, and chocolate mocha with cream (homemade). Snacks-heart healthy or mixed 2 handful over course of the day. Dinner-chicken pot pie with sides of bread or spaghetti.  Elanor is currently in the action stage of change and ready to dedicate time achieving and maintaining a healthier weight. Joyce Wright is interested in becoming our patient and working on intensive lifestyle modifications including (but not limited to) diet and exercise for weight loss.  Joyce Wright's habits were reviewed today and are as follows: Her family eats meals together, her desired weight loss is 24-34 lbs, she has been heavy most of her life, she started gaining weight after marriage and after moving in with her parents, her heaviest weight ever was 250 pounds, she has significant food cravings issues, she skips meals frequently, she frequently makes poor food choices, she frequently eats larger portions than normal and she struggles with emotional eating.  Depression Screen Joyce Wright's Food and Mood (modified PHQ-9) score was 7.  Depression screen PHQ 2/9 11/08/2019  Decreased Interest 1  Down, Depressed, Hopeless 2  PHQ - 2 Score 3  Altered sleeping 0  Tired, decreased energy 1  Change in appetite 1  Feeling bad or failure about yourself  1  Trouble concentrating 1  Moving slowly or  fidgety/restless 0  Suicidal thoughts 0  PHQ-9 Score 7  Difficult doing work/chores Somewhat difficult   Subjective:   1. Other fatigue Joyce Wright admits to daytime somnolence and admits to waking up still tired. Patent has a history of symptoms of daytime fatigue and morning headache. Joyce Wright generally gets 6 or 8 hours of sleep per night, and states that she has generally restful sleep. Snoring is present. Apneic episodes are not present. Epworth Sleepiness Score is 3. EKG-normal sinus rhythm at 97 BPM.  2. SOB (shortness of breath) on exertion Dereka notes increasing shortness of breath with exercising and seems to be worsening over time with weight gain. She notes getting out of breath sooner with activity than she used to. This has not gotten worse recently. Joyce Wright denies shortness of breath at rest or orthopnea.  3. Vitamin D deficiency Joyce Wright's diagnosis is likely given obesity, and notes fatigue.  4. Depression with anxiety Joyce Wright denies suicidal or homicidal ideas, and she was previously on Lexapro.  5. At risk for deficient intake of food The patient is at a higher than average risk of deficient intake of food due to skipping meals.  Assessment/Plan:   1. Other fatigue Neva does feel that her weight is causing her energy to be lower than it should be. Fatigue may be related to obesity, depression or many other causes. Labs will be ordered, and in the meanwhile, Niki will focus on self care including making healthy food choices, increasing physical activity and focusing on stress reduction.  - EKG 12-Lead - Vitamin B12 - Comprehensive metabolic panel - Folate -  Hemoglobin A1c - Insulin, random - T3 - T4 - TSH  2. SOB (shortness of breath) on exertion Joyce Wright does feel that she gets out of breath more easily that she used to when she exercises. Joyce Wright's shortness of breath appears to be obesity related and exercise induced. She has agreed to work on weight loss and gradually increase exercise  to treat her exercise induced shortness of breath. Will continue to monitor closely.  - CBC with Differential/Platelet - Lipid Panel With LDL/HDL Ratio  3. Vitamin D deficiency Low Vitamin D level contributes to fatigue and are associated with obesity, breast, and colon cancer. We will check labs today. Joyce Wright will follow-up for routine testing of Vitamin D, at least 2-3 times per year to avoid over-replacement.  - VITAMIN D 25 Hydroxy (Vit-D Deficiency, Fractures)  4. Depression with anxiety Behavior modification techniques were discussed today to help Joyce Wright deal with her emotional/non-hunger eating behaviors. We will follow up at her next appointment. Orders and follow up as documented in patient record.   5. At risk for deficient intake of food Joyce Wright was given approximately 15 minutes of deficit intake of food prevention counseling today. Joyce Wright is at risk for eating too few calories based on current food recall. She was encouraged to focus on meeting caloric and protein goals according to her recommended meal plan.   6. Class 1 obesity with serious comorbidity and body mass index (BMI) of 31.0 to 31.9 in adult, unspecified obesity type Joyce Wright is currently in the action stage of change and her goal is to continue with weight loss efforts. I recommend Joyce Wright begin the structured treatment plan as follows:  She has agreed to the Category 1 Plan + 100 calories.  Exercise goals: As is.   Behavioral modification strategies: increasing lean protein intake, meal planning and cooking strategies, keeping healthy foods in the home and dealing with family or coworker sabotage.  She was informed of the importance of frequent follow-up visits to maximize her success with intensive lifestyle modifications for her multiple health conditions. She was informed we would discuss her lab results at her next visit unless there is a critical issue that needs to be addressed sooner. Joyce Wright agreed to keep her next visit at  the agreed upon time to discuss these results.  Objective:   Blood pressure 105/71, pulse (!) 104, temperature 98.3 F (36.8 C), temperature source Oral, height 5\' 6"  (1.676 m), weight 194 lb (88 kg), last menstrual period 11/04/2019, SpO2 100 %. Body mass index is 31.31 kg/m.  EKG: Normal sinus rhythm, rate 97 BPM.  Indirect Calorimeter completed today shows a VO2 of 149 and a REE of 1034.  Her calculated basal metabolic rate is 11/06/2019 thus her basal metabolic rate is worse than expected.  General: Cooperative, alert, well developed, in no acute distress. HEENT: Conjunctivae and lids unremarkable. Cardiovascular: Regular rhythm.  Lungs: Normal work of breathing. Neurologic: No focal deficits.   Lab Results  Component Value Date   CREATININE 0.69 11/08/2019   BUN 8 11/08/2019   NA 138 11/08/2019   K 4.7 11/08/2019   CL 102 11/08/2019   CO2 24 11/08/2019   Lab Results  Component Value Date   ALT 14 11/08/2019   AST 19 11/08/2019   ALKPHOS 61 11/08/2019   BILITOT 0.4 11/08/2019   Lab Results  Component Value Date   HGBA1C 5.1 11/08/2019   Lab Results  Component Value Date   INSULIN 6.4 11/08/2019   Lab Results  Component  Value Date   TSH 0.801 11/08/2019   Lab Results  Component Value Date   CHOL 148 11/08/2019   HDL 51 11/08/2019   LDLCALC 87 11/08/2019   TRIG 48 11/08/2019   Lab Results  Component Value Date   WBC 5.9 11/08/2019   HGB 13.3 11/08/2019   HCT 39.1 11/08/2019   MCV 91 11/08/2019   PLT 318 11/08/2019   No results found for: IRON, TIBC, FERRITIN  Attestation Statements:   Reviewed by clinician on day of visit: allergies, medications, problem list, medical history, surgical history, family history, social history, and previous encounter notes.   I, Burt Knack, am acting as transcriptionist for Reuben Likes, MD.  This is the patient's first visit at [facility]. The patient's NEW PATIENT PACKET was reviewed at  length. Included in the packet: current and past health history, medications, allergies, ROS, gynecologic history (women only), surgical history, family history, social history, weight history, weight loss surgery history (for those that have had weight loss surgery), nutritional evaluation, mood and food questionnaire, PHQ9, Epworth questionnaire, sleep habits questionnaire, patient life and health improvement goals questionnaire. These will all be scanned into the patient's chart under media.   During the visit, I independently reviewed the patient's EKG, bioimpedance scale results, and indirect calorimeter results. I used this information to tailor a meal plan for the patient that will help her to lose weight and will improve her obesity-related conditions going forward. I performed a medically necessary appropriate examination and/or evaluation. I discussed the assessment and treatment plan with the patient. The patient was provided an opportunity to ask questions and all were answered. The patient agreed with the plan and demonstrated an understanding of the instructions. Labs were ordered at this visit and will be reviewed at the next visit unless more critical results need to be addressed immediately. Clinical information was updated and documented in the EMR.   Time spent on visit including pre-visit chart review and post-visit care was 45 minutes.   A separate 15 minutes was spent on risk counseling (see above).   I have reviewed the above documentation for accuracy and completeness, and I agree with the above. - Katherina Mires, MD

## 2019-11-22 ENCOUNTER — Ambulatory Visit (INDEPENDENT_AMBULATORY_CARE_PROVIDER_SITE_OTHER): Payer: No Typology Code available for payment source | Admitting: Family Medicine

## 2020-05-25 ENCOUNTER — Ambulatory Visit: Payer: No Typology Code available for payment source

## 2020-07-17 ENCOUNTER — Telehealth (HOSPITAL_BASED_OUTPATIENT_CLINIC_OR_DEPARTMENT_OTHER): Payer: Self-pay

## 2020-07-17 NOTE — Telephone Encounter (Signed)
Joyce Wright is a 35 y.o. female called asking for an appt to see Dr. Hyacinth Meeker. Pt said she was seen in the ED 07/14/20 for abdominal pain that has her leg swollen. Pt said the ED r/o DVT and said she is constipated and should f/u with her pcp. Pt would like to f/u with Dr. Hyacinth Meeker.

## 2020-08-02 ENCOUNTER — Encounter (HOSPITAL_BASED_OUTPATIENT_CLINIC_OR_DEPARTMENT_OTHER): Payer: Self-pay | Admitting: Obstetrics & Gynecology

## 2020-08-02 ENCOUNTER — Ambulatory Visit (INDEPENDENT_AMBULATORY_CARE_PROVIDER_SITE_OTHER): Payer: No Typology Code available for payment source | Admitting: Obstetrics & Gynecology

## 2020-08-02 ENCOUNTER — Other Ambulatory Visit: Payer: Self-pay

## 2020-08-02 VITALS — BP 122/94 | HR 117 | Ht 66.0 in | Wt 208.8 lb

## 2020-08-02 DIAGNOSIS — R3915 Urgency of urination: Secondary | ICD-10-CM

## 2020-08-02 DIAGNOSIS — R350 Frequency of micturition: Secondary | ICD-10-CM

## 2020-08-02 DIAGNOSIS — R1031 Right lower quadrant pain: Secondary | ICD-10-CM | POA: Diagnosis not present

## 2020-08-02 MED ORDER — MELOXICAM 15 MG PO TABS: 15.0000 mg | ORAL_TABLET | Freq: Every day | ORAL | 0 refills | Status: DC

## 2020-08-02 NOTE — Progress Notes (Signed)
GYNECOLOGY  VISIT  CC:   left groin pain  HPI: 35 y.o. G0P0000 Legally Separated Other or two or more races female here for complaint of left groin that has been going on for several weeks.  In the last month, pt has undergone RLE doppler to r/o DVT when had swelling in lower leg on right side (negative) and has been seen by internist at [facility] for generalized abdominal pain.  Labs from this 07/14/2020 visit reviewed.  CT was done.  Reviewed with pt today.  States she was told she has constipation and to start miralax.  She did take some but it didn't help.    What is really bothering her in focal tenderness just at her groin.  She was having some back pain and abdominal pain but these ave resolved.  Wasn't really sue what to do and was advised when she calls that I do not think this was gyn related.  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  It sits up higher than her car did.  She also used to reach across to drive the car and now does not.  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    She does have an IUD and has minimal spotting.  SA with partner.  No STD concerns.  Separately and unrelated, she continues to have issues with urinary urgency.  Does not leak.  Can need to void every hour.  Smoking possibly contributing.  Doesn't have UTIs.  Would like evaluation at this point.  We have discussed this in the past.  GYNECOLOGIC HISTORY: No LMP recorded. (Menstrual status: IUD). Contraception: IUD  Patient Active Problem List   Diagnosis Date Noted   Seasonal allergic rhinitis due to pollen 04/15/2018   Recurrent major depressive disorder, in partial remission (HCC) 12/11/2016   Chronic midline low back pain without sciatica 12/11/2016    Past Medical History:  Diagnosis Date   Anxiety    Back pain    Constipation    Depression    Depression     No past surgical history on file.  MEDS:   Current Outpatient  Medications on File Prior to Visit  Medication Sig Dispense Refill   Aspirin-Acetaminophen-Caffeine (EXCEDRIN EXTRA STRENGTH PO) Take by mouth.     levonorgestrel (MIRENA) 20 MCG/24HR IUD 1 each by Intrauterine route once.     naproxen sodium (ALEVE) 220 MG tablet Take by mouth daily as needed.     phentermine 37.5 MG capsule Take 37.5 mg by mouth every morning. (Patient not taking: Reported on 08/02/2020)     No current facility-administered medications on file prior to visit.    ALLERGIES: Patient has no known allergies.  Family History  Problem Relation Age of Onset   Breast cancer Maternal Aunt        "Bonita Quin" died in her 55's   Diabetes Mother    Hypertension Mother    Depression Mother    Anxiety disorder Mother    Obesity Mother    Stroke Maternal Grandmother    Depression Father    Obesity Father    Lung cancer Maternal Grandfather    Breast cancer Maternal Aunt        "Patsy" in her 39's   Breast cancer Maternal Aunt        has breast cancer precursor and bilateral mastectomy    SH:  separated, smokes  Review of  Systems  Constitutional: Negative.   Respiratory: Negative.    Cardiovascular: Negative.   Gastrointestinal: Negative.   Hematological: Negative.    PHYSICAL EXAMINATION:    BP (!) 122/94 (BP Location: Right Arm, Patient Position: Sitting, Cuff Size: Large)   Pulse (!) 117   Ht 5\' 6"  (1.676 m)   Wt 208 lb 12.8 oz (94.7 kg)   BMI 33.70 kg/m     Physical Exam Constitutional:      Appearance: Normal appearance.  Abdominal:     General: Abdomen is flat. Bowel sounds are normal. There is no distension.     Palpations: Abdomen is soft. There is no mass.     Hernia: No hernia is present.     Comments: Tenderness to palpation of right side of pubic symphysis   Musculoskeletal:        General: No swelling or tenderness. Normal range of motion.     Cervical back: Normal range of motion.  Skin:    General: Skin is warm.  Neurological:     General: No  focal deficit present.     Mental Status: She is alert.  Psychiatric:        Mood and Affect: Mood normal.     Assessment/Plan: 1. Right inguinal pain - this just does not seem gyn related.  Given recent change in vehicle she drives and change in motion to deliver mail and with hx of low back issues, feel could joint related.  Will refer for additional evaluation.  - AMB referral to sports medicine  2. Urinary urgency - Ambulatory referral to Urogynecology  3. Frequent urination

## 2020-08-09 ENCOUNTER — Other Ambulatory Visit: Payer: Self-pay

## 2020-08-09 ENCOUNTER — Ambulatory Visit (INDEPENDENT_AMBULATORY_CARE_PROVIDER_SITE_OTHER): Payer: No Typology Code available for payment source

## 2020-08-09 ENCOUNTER — Encounter: Payer: Self-pay | Admitting: Family Medicine

## 2020-08-09 ENCOUNTER — Ambulatory Visit (INDEPENDENT_AMBULATORY_CARE_PROVIDER_SITE_OTHER): Payer: No Typology Code available for payment source | Admitting: Family Medicine

## 2020-08-09 VITALS — BP 120/84 | HR 97 | Ht 66.0 in | Wt 206.0 lb

## 2020-08-09 DIAGNOSIS — M545 Low back pain, unspecified: Secondary | ICD-10-CM | POA: Diagnosis not present

## 2020-08-09 DIAGNOSIS — M9904 Segmental and somatic dysfunction of sacral region: Secondary | ICD-10-CM | POA: Diagnosis not present

## 2020-08-09 DIAGNOSIS — M25551 Pain in right hip: Secondary | ICD-10-CM

## 2020-08-09 DIAGNOSIS — M9902 Segmental and somatic dysfunction of thoracic region: Secondary | ICD-10-CM

## 2020-08-09 DIAGNOSIS — G8929 Other chronic pain: Secondary | ICD-10-CM

## 2020-08-09 DIAGNOSIS — M6289 Other specified disorders of muscle: Secondary | ICD-10-CM

## 2020-08-09 NOTE — Assessment & Plan Note (Signed)

## 2020-08-09 NOTE — Assessment & Plan Note (Signed)
We will monitor the patient if no improvement do consider formal physical therapy for pelvic floor

## 2020-08-09 NOTE — Patient Instructions (Signed)
Xray right hip SI joint exercises Continue meloxicam Tried OMT Ice 20 min at end of day See me in 4-6 weeks

## 2020-08-09 NOTE — Assessment & Plan Note (Signed)
Patient has pain that seems to be more of the sacroiliac joint.  Patient given hip abductor strengthening.  Has meloxicam for breakthrough.  We will get x-rays of the hip to further evaluate as well as somewhat of the lumbar spine.  Patient does have signs and symptoms that is also consistent with some pelvic floor dysfunction and will need to continue to monitor as well.  Follow-up with me again 6 weeks

## 2020-08-09 NOTE — Progress Notes (Signed)
[name] [address] [place] [address] Phone: [phone] Subjective:   Joyce Wright, am serving as a scribe for Dr. Antoine Primas.  I'm seeing this patient by the request  of:  Mattie Marlin, MD  CC: Low back pain  [id number]  Joyce Wright is a 35 y.o. female coming in with complaint of Groin pain on R side. The pain started in R leg and was checked for Blood clot which was negative, had a CT scan and that was fine. Patient's gynecologist stated it was more her groin that was causing the issue. Patient will have intermittent shape pain in groin area that will radiate to the R hip Causing her to limp. Taking meloxicam to help with pain. Pain is worse when she is driving and when crossing legs.       Past Medical History:  Diagnosis Date   Anxiety    Back pain    Constipation    Depression    Depression    History reviewed. No pertinent surgical history. Social History   Socioeconomic History   Marital status: Legally Separated    Spouse name: Not on file   Number of children: Not on file   Years of education: Not on file   Highest education level: Not on file  Occupational History   Occupation: [employer]  Tobacco Use   Smoking status: Every Day    Packs/day: 0.50    Types: Cigarettes    Last attempt to quit: 2013    Years since quitting: 9.5   Smokeless tobacco: Never  Vaping Use   Vaping Use: Former  Substance and Sexual Activity   Alcohol use: Not Currently    Comment: occ   Drug use: No   Sexual activity: Yes    Partners: Male    Birth control/protection: I.U.D.    Comment: Mirena placed 09/11/17  Other Topics Concern   Not on file  Social History Narrative   Not on file   Social Determinants of Health   Financial Resource Strain: Not on file  Food Insecurity: Not on file  Transportation Needs: Not on file  Physical Activity: Not on file  Stress: Not on file  Social Connections: Not on file    No Known Allergies Family History  Problem Relation Age of Onset   Breast cancer Maternal Aunt        "Bonita Quin" died in her 15's   Diabetes Mother    Hypertension Mother    Depression Mother    Anxiety disorder Mother    Obesity Mother    Stroke Maternal Grandmother    Depression Father    Obesity Father    Lung cancer Maternal Grandfather    Breast cancer Maternal Aunt        "Patsy" in her 70's   Breast cancer Maternal Aunt        has breast cancer precursor and bilateral mastectomy    Current Outpatient Medications (Endocrine & Metabolic):    levonorgestrel (MIRENA) 20 MCG/24HR IUD, 1 each by Intrauterine route once.    Current Outpatient Medications (Analgesics):    Aspirin-Acetaminophen-Caffeine (EXCEDRIN EXTRA STRENGTH PO), Take by mouth.   meloxicam (MOBIC) 15 MG tablet, Take 1 tablet (15 mg total) by mouth daily.   naproxen sodium (ALEVE) 220 MG tablet, Take by mouth daily as needed.   Current Outpatient Medications (Other):    phentermine 37.5 MG capsule, Take 37.5 mg by mouth every morning. (Patient not taking:  No sig reported)   Reviewed prior external information including notes and imaging from  primary care provider As well as notes that were available from care everywhere and other healthcare systems.  Past medical history, social, surgical and family history all reviewed in electronic medical record.  No pertanent information unless stated regarding to the chief complaint.   Review of Systems:  No headache, visual changes, nausea, vomiting, diarrhea, constipation, dizziness, abdominal pain, skin rash, fevers, chills, night sweats, weight loss, swollen lymph nodes, body aches, joint swelling, chest pain, shortness of breath, mood changes. POSITIVE muscle aches  Objective  Blood pressure 120/84, pulse 97, height 5\' 6"  (1.676 m), weight 206 lb (93.4 kg), last menstrual period 07/26/2020, SpO2 99 %.   General: No apparent distress alert and oriented x3  mood and affect normal, dressed appropriately.  HEENT: Pupils equal, extraocular movements intact  Respiratory: Patient's speak in full sentences and does not appear short of breath  Cardiovascular: No lower extremity edema, non tender, no erythema  Gait normal with good balance and coordination.  MSK: Patient's low back does have some tightness noted mostly around the right sacroiliac joint.  Positive FABER test.  Negative straight leg test. Patient is 5 out of 5 strength in lower extremities.  Osteopathic findings T8 extended rotated and side bent right L3 flexed rotated and side bent right Sacrum right on right   Impression and Recommendations:     The above documentation has been reviewed and is accurate and complete 07/28/2020, DO

## 2020-09-07 NOTE — Progress Notes (Signed)
[facility] [address] [place] [address] Phone: [phone] Subjective:   Joyce [facility], am serving as a scribe for Dr. Antoine Primas. This visit occurred during the SARS-CoV-2 public health emergency.  Safety protocols were in place, including screening questions prior to the visit, additional usage of staff PPE, and extensive cleaning of exam room while observing appropriate contact time as indicated for disinfecting solutions.   I'm seeing this patient by the request  of:  Mattie Marlin, MD  CC: Neck and back pain follow-up  [id number]  Joyce Wright is a 35 y.o. female coming in with complaint of back and neck pain. OMT 08/09/2020. Patient states her hip is getting better, but her lower back is not. She feels a tension on the left side when doing her job.  Patient has noticed some discomfort as well.  Nothing that is severe but does sometimes make it difficult to fall asleep.  Patient is doing things such as the exercises as well as rotating mattress.  Continue to work on weight loss.  Medications patient has been prescribed: None         Past Medical History:  Diagnosis Date   Anxiety    Back pain    Constipation    Depression    Depression     No Known Allergies   Review of Systems:  No headache, visual changes, nausea, vomiting, diarrhea, constipation, dizziness, abdominal pain, skin rash, fevers, chills, night sweats, weight loss, swollen lymph nodes, body aches, joint swelling, chest pain, shortness of breath, mood changes. POSITIVE muscle aches  Objective  Blood pressure 112/68, pulse 96, height 5\' 6"  (1.676 m), weight 209 lb (94.8 kg), SpO2 99 %.   General: No apparent distress alert and oriented x3 mood and affect normal, dressed appropriately.  HEENT: Pupils equal, extraocular movements intact  Respiratory: Patient's speak in full sentences and does not appear short of breath  Cardiovascular: No lower extremity edema,  non tender, no erythema  Patient has significant tightness still noted in the thoracolumbar juncture and minorly over the sacroiliac joints bilaterally.  Tightness with FABER test.  Patient does still have some weakness of the hip abductors bilaterally.  Osteopathic findings  T9 extended rotated and side bent left L2 flexed rotated and side bent right Sacrum right on right       Assessment and Plan:  Chronic midline low back pain without sciatica Low back exam does show some mild loss of lordosis.  Some tenderness to palpation was noted still on the hips bilaterally.  Responded extremely well to osteopathic manipulation.  Encourage patient to continue with the exercises, core strengthening and continue to monitor the weight.  No changes in the medication at this moment but does have the meloxicam.  Follow-up with me again in 6 to 8 weeks.   Nonallopathic problems  Decision today to treat with OMT was based on Physical Exam  After verbal consent patient was treated with HVLA, ME, FPR techniques in  thoracic, lumbar, and sacral  areas  Patient tolerated the procedure well with improvement in symptoms  Patient given exercises, stretches and lifestyle modifications  See medications in patient instructions if given  Patient will follow up in 4-8 weeks      The above documentation has been reviewed and is accurate and complete , DO        Note: This dictation was prepared with Dragon dictation along with smaller phrase technology. Any transcriptional errors  that result from this process are unintentional.

## 2020-09-08 ENCOUNTER — Ambulatory Visit (INDEPENDENT_AMBULATORY_CARE_PROVIDER_SITE_OTHER): Payer: No Typology Code available for payment source | Admitting: Family Medicine

## 2020-09-08 ENCOUNTER — Other Ambulatory Visit: Payer: Self-pay

## 2020-09-08 ENCOUNTER — Telehealth: Payer: Self-pay | Admitting: Family Medicine

## 2020-09-08 VITALS — BP 112/68 | HR 96 | Ht 66.0 in | Wt 209.0 lb

## 2020-09-08 DIAGNOSIS — M545 Low back pain, unspecified: Secondary | ICD-10-CM | POA: Diagnosis not present

## 2020-09-08 DIAGNOSIS — M9902 Segmental and somatic dysfunction of thoracic region: Secondary | ICD-10-CM | POA: Diagnosis not present

## 2020-09-08 DIAGNOSIS — M9903 Segmental and somatic dysfunction of lumbar region: Secondary | ICD-10-CM

## 2020-09-08 DIAGNOSIS — M9904 Segmental and somatic dysfunction of sacral region: Secondary | ICD-10-CM | POA: Diagnosis not present

## 2020-09-08 DIAGNOSIS — G8929 Other chronic pain: Secondary | ICD-10-CM

## 2020-09-08 MED ORDER — MELOXICAM 15 MG PO TABS: 15.0000 mg | ORAL_TABLET | Freq: Every day | ORAL | 0 refills | Status: AC

## 2020-09-08 NOTE — Telephone Encounter (Signed)
Patient called asking if Dr Katrinka Blazing would be able to refill her Meloxicam. She also said that Dr Katrinka Blazing mentioned sending in another medication for her?  Please advise.  Pharmacy: [facility] in [place].

## 2020-09-08 NOTE — Telephone Encounter (Signed)
Sent in meloxicam I am sorry do not remember for sure. My feeling is gabapentin If so I am ok with sending it in  Will send message in my chart

## 2020-09-08 NOTE — Assessment & Plan Note (Signed)
Low back exam does show some mild loss of lordosis.  Some tenderness to palpation was noted still on the hips bilaterally.  Responded extremely well to osteopathic manipulation.  Encourage patient to continue with the exercises, core strengthening and continue to monitor the weight.  No changes in the medication at this moment but does have the meloxicam.  Follow-up with me again in 6 to 8 weeks.

## 2020-09-08 NOTE — Patient Instructions (Signed)
Good to see you Thanks for letting me show off my doggie Have a good weekend See you again in 1-2 months

## 2020-11-09 ENCOUNTER — Ambulatory Visit: Payer: No Typology Code available for payment source | Admitting: Family Medicine

## 2020-11-10 ENCOUNTER — Ambulatory Visit: Payer: No Typology Code available for payment source | Admitting: Family Medicine

## 2020-11-17 ENCOUNTER — Ambulatory Visit (HOSPITAL_BASED_OUTPATIENT_CLINIC_OR_DEPARTMENT_OTHER): Payer: No Typology Code available for payment source | Admitting: Medical

## 2020-12-12 ENCOUNTER — Ambulatory Visit: Payer: No Typology Code available for payment source | Admitting: Family Medicine

## 2021-08-15 ENCOUNTER — Encounter (INDEPENDENT_AMBULATORY_CARE_PROVIDER_SITE_OTHER): Payer: Self-pay

## 2022-06-05 ENCOUNTER — Encounter (HOSPITAL_BASED_OUTPATIENT_CLINIC_OR_DEPARTMENT_OTHER): Payer: Self-pay | Admitting: Obstetrics & Gynecology

## 2022-06-06 NOTE — Telephone Encounter (Signed)
Called pt to set up appt for aex/PAP. Appt scheduled.

## 2022-09-12 ENCOUNTER — Ambulatory Visit (HOSPITAL_BASED_OUTPATIENT_CLINIC_OR_DEPARTMENT_OTHER): Payer: No Typology Code available for payment source | Admitting: Obstetrics & Gynecology

## 2023-01-24 ENCOUNTER — Encounter (HOSPITAL_BASED_OUTPATIENT_CLINIC_OR_DEPARTMENT_OTHER): Payer: Self-pay | Admitting: Certified Nurse Midwife

## 2023-01-24 ENCOUNTER — Ambulatory Visit (INDEPENDENT_AMBULATORY_CARE_PROVIDER_SITE_OTHER): Payer: No Typology Code available for payment source | Admitting: Certified Nurse Midwife

## 2023-01-24 ENCOUNTER — Other Ambulatory Visit (HOSPITAL_COMMUNITY)
Admission: RE | Admit: 2023-01-24 | Discharge: 2023-01-24 | Disposition: A | Discharge status: Home / Self Care | Payer: No Typology Code available for payment source | Source: Ambulatory Visit | Attending: Certified Nurse Midwife | Admitting: Certified Nurse Midwife

## 2023-01-24 VITALS — BP 131/89 | HR 109 | Ht 66.0 in | Wt 225.2 lb

## 2023-01-24 DIAGNOSIS — Z124 Encounter for screening for malignant neoplasm of cervix: Secondary | ICD-10-CM

## 2023-01-24 DIAGNOSIS — M549 Dorsalgia, unspecified: Secondary | ICD-10-CM | POA: Diagnosis not present

## 2023-01-24 DIAGNOSIS — Z30430 Encounter for insertion of intrauterine contraceptive device: Secondary | ICD-10-CM

## 2023-01-24 DIAGNOSIS — Z30433 Encounter for removal and reinsertion of intrauterine contraceptive device: Secondary | ICD-10-CM

## 2023-01-24 MED ORDER — METHOCARBAMOL 500 MG PO TABS: 500.0000 mg | ORAL_TABLET | Freq: Four times a day (QID) | ORAL | 0 refills | Status: AC

## 2023-01-24 MED ORDER — LEVONORGESTREL 20 MCG/DAY IU IUD
1.0000 | INTRAUTERINE_SYSTEM | Freq: Once | INTRAUTERINE | Status: AC
  Administered 2023-01-24: 1 via INTRAUTERINE

## 2023-01-24 NOTE — Progress Notes (Signed)
GYNECOLOGY  VISIT  CC:   Pt here for removal of Mirena and desires insertion new Mirena  HPI: 38 y.o. G0P0000 Legally Separated Other or two or more races female here for IUD removal and insertion new Mirena. She is sexually active and monogamous and does not desire childbearing. Pt works full time for [employer]. She recently had an accident on the ice and is experiencing back pain. She was moving slowly today due to back pain. She is also due for routine pap smear and is agreeable to pap smear today.   Past Medical History:  Diagnosis Date   Anxiety    Back pain    Constipation    Depression    Depression     MEDS:   Current Outpatient Medications on File Prior to Visit  Medication Sig Dispense Refill   Aspirin-Acetaminophen-Caffeine (EXCEDRIN EXTRA STRENGTH PO) Take by mouth.     cyclobenzaprine (FLEXERIL) 10 MG tablet Take 10 mg by mouth at bedtime.     levonorgestrel (MIRENA) 20 MCG/24HR IUD 1 each by Intrauterine route once.     sertraline (ZOLOFT) 50 MG tablet Take by mouth.     tretinoin (RETIN-A) 0.05 % cream Apply topically at bedtime.     meloxicam (MOBIC) 15 MG tablet Take 1 tablet (15 mg total) by mouth daily. (Patient not taking: Reported on 01/24/2023) 30 tablet 0   naproxen sodium (ALEVE) 220 MG tablet Take by mouth daily as needed. (Patient not taking: Reported on 01/24/2023)     phentermine 37.5 MG capsule Take 37.5 mg by mouth every morning. (Patient not taking: Reported on 01/24/2023)     No current facility-administered medications on file prior to visit.    ALLERGIES: Patient has no known allergies.  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   PHYSICAL EXAMINATION:    BP 131/89 (BP Location: Right Arm, Patient Position: Sitting, Cuff Size: Large)   Pulse (!) 109   Ht 5\' 6"  (1.676 m)   Wt 225 lb 3.2 oz (102.2 kg)   BMI 36.35 kg/m     General  appearance: alert, cooperative and appears stated age  Pelvic: External genitalia:  no lesions              Urethra:  normal appearing urethra with no masses, tenderness or lesions              Bartholins and Skenes: normal                 Vagina: normal mucosa without prolapse or lesions              Cervix: no lesions and IUD strings visible 2cm              Bimanual Exam:  Uterus:  normal size, contour, position, consistency, mobility, non-tender              Adnexa: no mass, fullness, tenderness      Chaperone, Hendricks Milo, CMA, was present for exam.  Assessment/Plan: 1. Encounter for IUD removal and reinsertion - Mirena IUD removed and new Mirena IUD inserted per pt request - Routine GC/CT screening  2. Cervical cancer screening (Primary) - Cytology - PAP( [facility])  3. Acute back pain, unspecified back location, unspecified back pain laterality - methocarbamol (ROBAXIN) 500 MG tablet; Take 1 tablet (500 mg total) by mouth 4 (four) times daily.  Dispense:  120 tablet; Refill: 0 - Ambulatory referral to Neurology  4. Encounter for insertion of Mirena IUD - levonorgestrel (MIRENA) 20 MCG/DAY IUD 1 each    GYNECOLOGY OFFICE PROCEDURE NOTE   IUD Insertion Procedure Note Patient identified, informed consent performed, consent signed.   Discussed risks of irregular bleeding, cramping, infection, malpositioning or misplacement of the IUD outside the uterus which may require further procedure such as laparoscopy. Time out was performed.    Speculum placed in the vagina.  Cervix visualized.  Cleaned with Betadine x 2.  Grasped anteriorly with a single tooth tenaculum.  Uterus sounded to 6 cm.   IUD placed per manufacturer's recommendations.  Strings trimmed to 3 cm. Tenaculum was removed, good hemostasis noted.  Patient tolerated procedure well.   Patient was given post-procedure instructions.  She was advised to have backup contraception for one week.  Patient was also asked to  check IUD strings periodically and follow up in 4 weeks for IUD check.  Return in about 2 months (around 03/24/2023) for IUD Check.   Letta Kocher, CNM 12:55 PM

## 2023-01-29 LAB — CYTOLOGY - PAP
Chlamydia: NEGATIVE
Comment: NEGATIVE
Comment: NEGATIVE
Comment: NEGATIVE
Comment: NORMAL
Diagnosis: NEGATIVE
High risk HPV: NEGATIVE
Neisseria Gonorrhea: NEGATIVE
Trichomonas: NEGATIVE

## 2023-01-30 ENCOUNTER — Encounter (HOSPITAL_BASED_OUTPATIENT_CLINIC_OR_DEPARTMENT_OTHER): Payer: Self-pay | Admitting: Certified Nurse Midwife

## 2023-02-03 ENCOUNTER — Ambulatory Visit (HOSPITAL_BASED_OUTPATIENT_CLINIC_OR_DEPARTMENT_OTHER): Payer: No Typology Code available for payment source | Admitting: Obstetrics & Gynecology

## 2023-07-09 IMAGING — DX DG HIP (WITH OR WITHOUT PELVIS) 2-3V*R*
3 series · 3 of 3 positions shown · non-contrast
Comparison: CT abdomen pelvis dated 07/21/2020.

CLINICAL DATA: Right hip pain.

EXAM:
DG HIP (WITH OR WITHOUT PELVIS) 2-3V RIGHT

[pelvis ap]
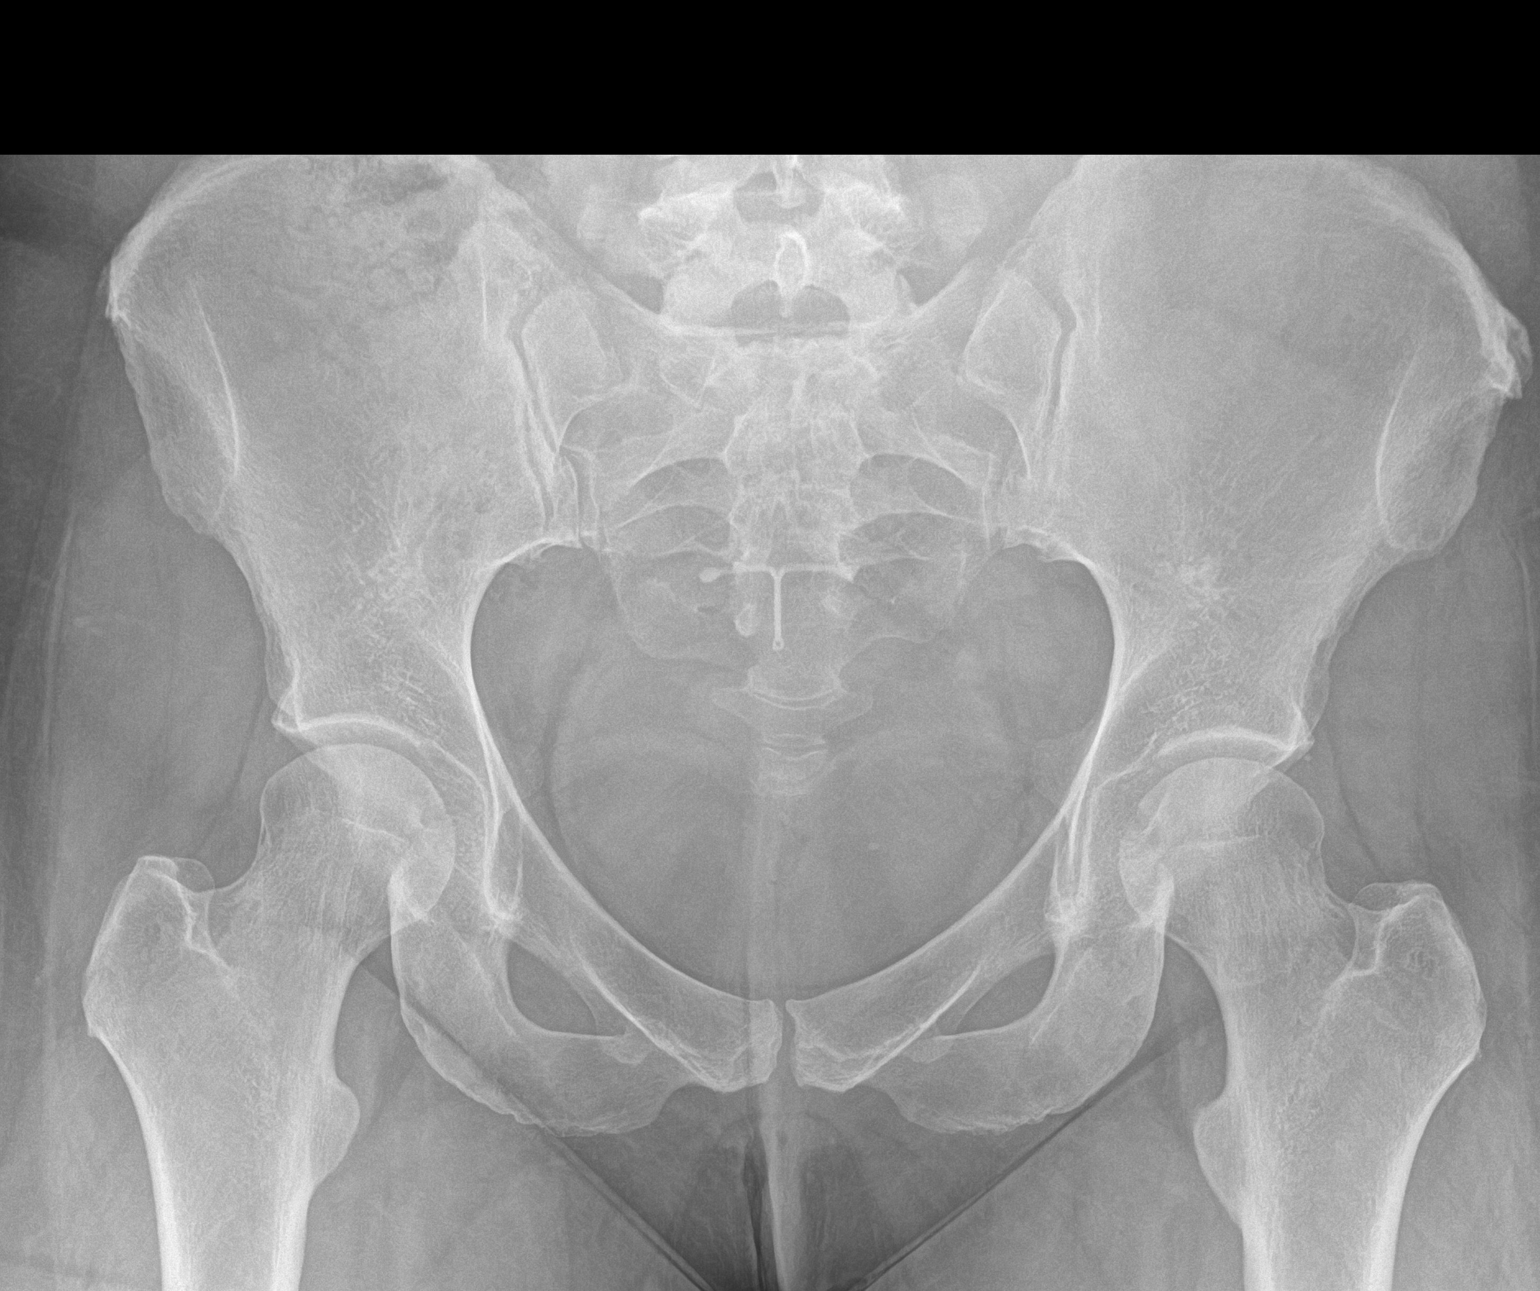

[hip ap]
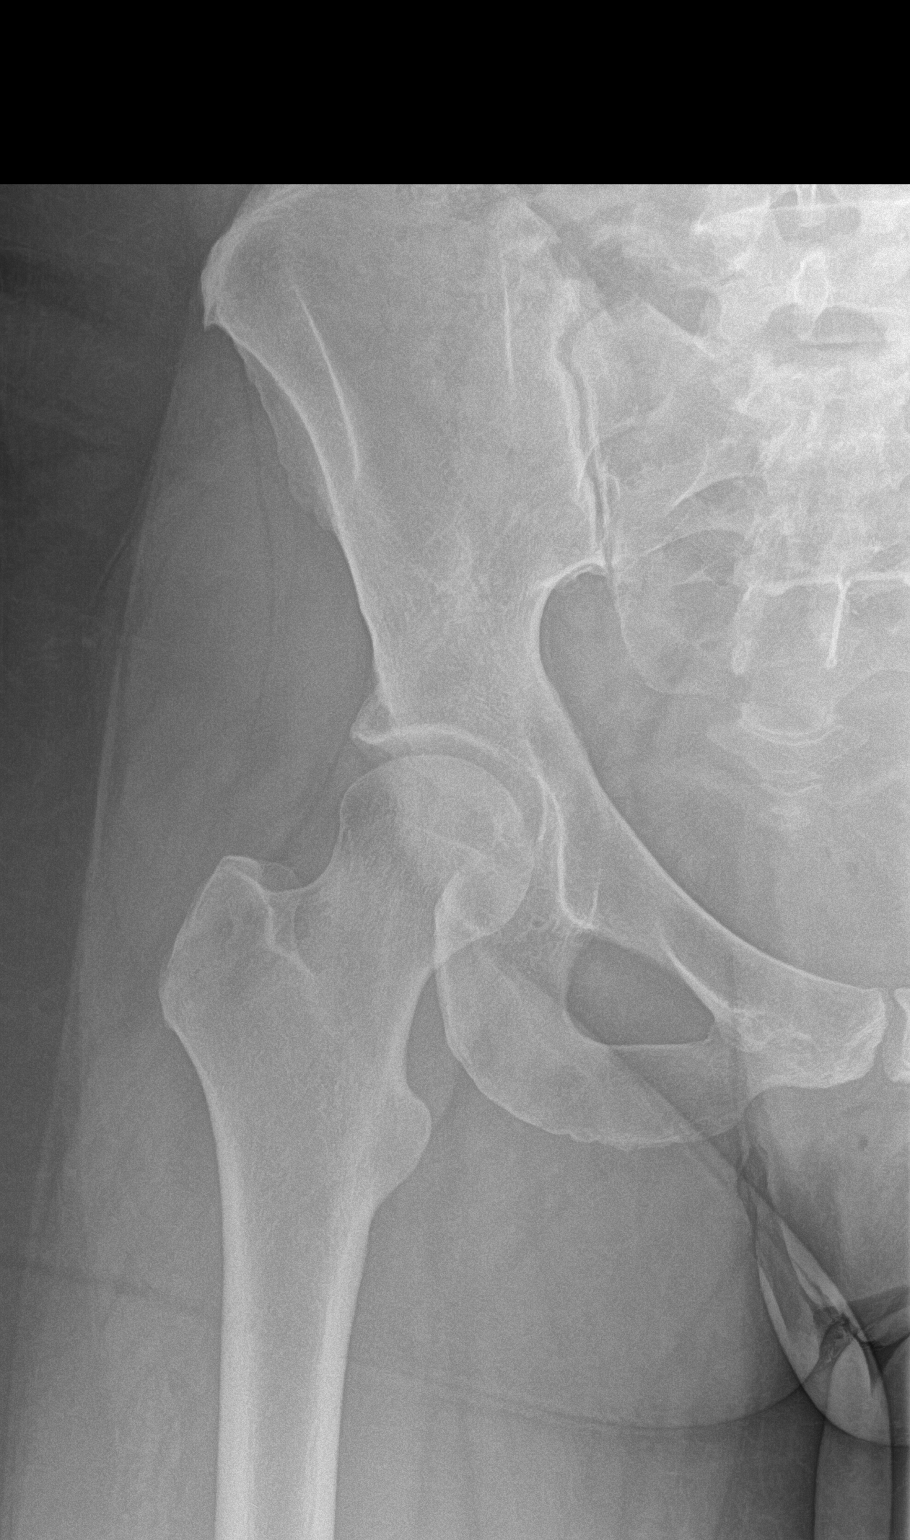

[hip frog leg]
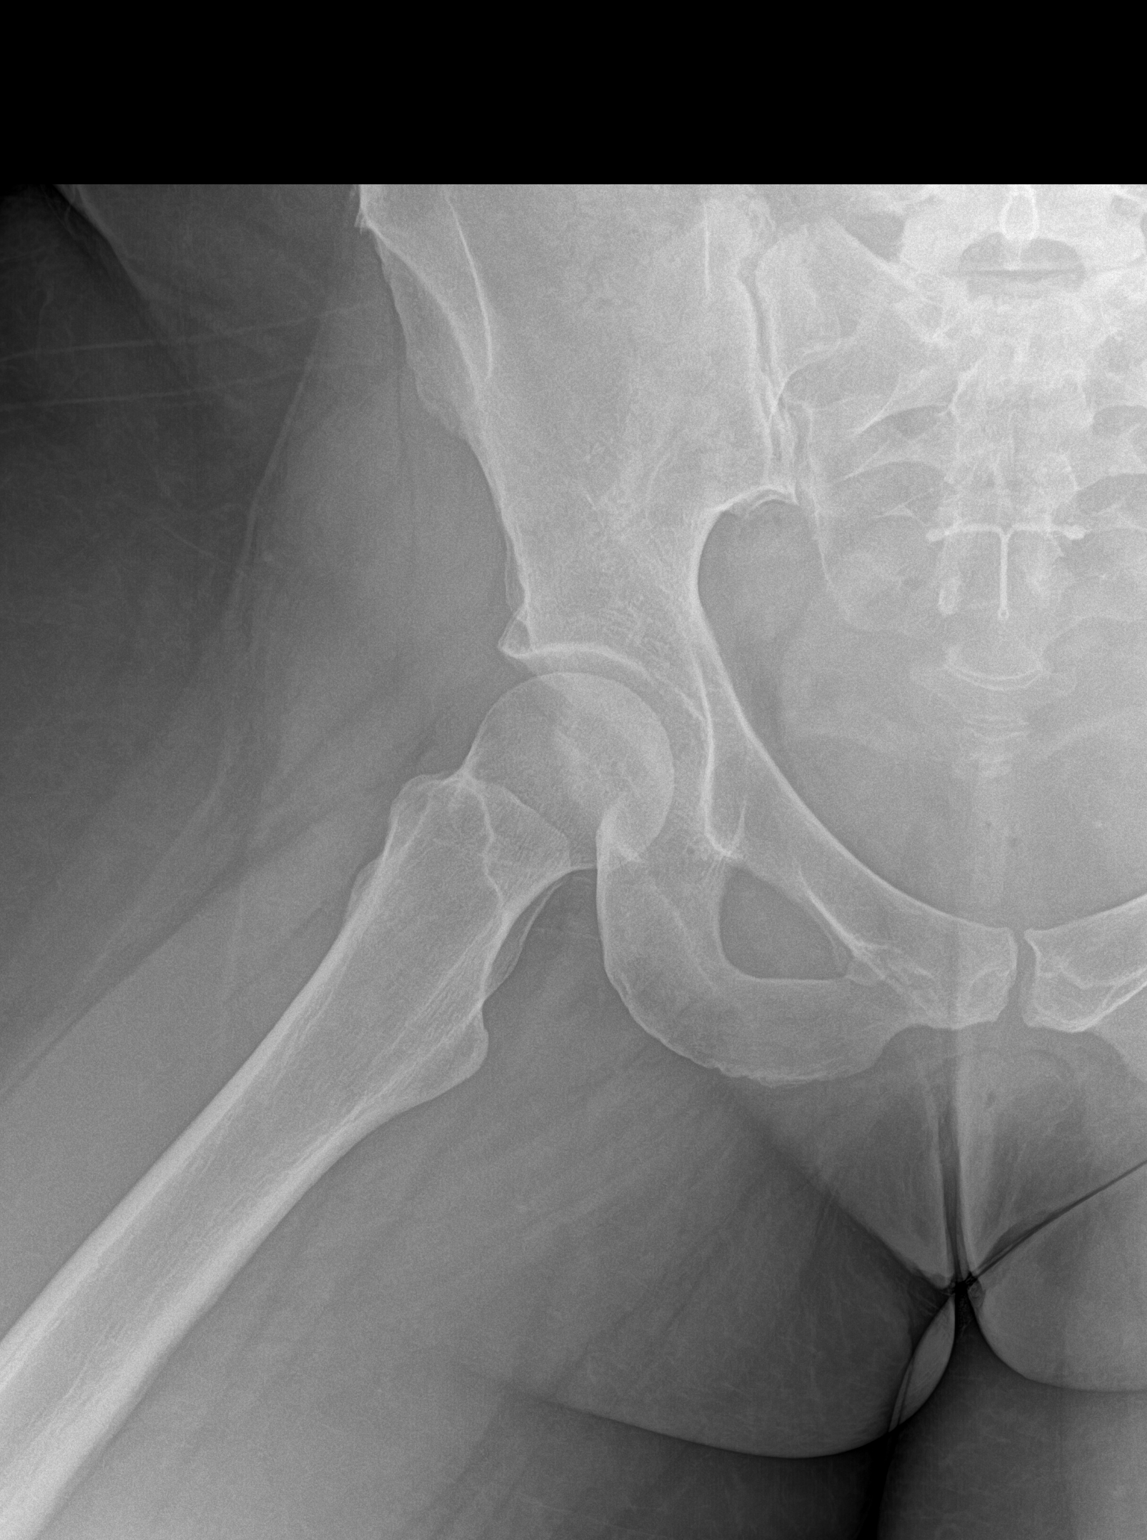

[3 of 3 positions shown; findings below may reference images not displayed]

FINDINGS: There is no acute fracture or dislocation. The bones are well
mineralized. No arthritic changes. The soft tissues are
unremarkable. An intrauterine device is noted.
IMPRESSION: Negative.
